# Patient Record
Sex: Male | Born: 1975 | Race: Black or African American | Hispanic: No | Smoking: Former smoker
Health system: Southern US, Community
[De-identification: ages and names within clinical notes are randomized; demographics above are authoritative.]

## PROBLEM LIST (undated history)

## (undated) DIAGNOSIS — S62309A Unspecified fracture of unspecified metacarpal bone, initial encounter for closed fracture: Secondary | ICD-10-CM

## (undated) DIAGNOSIS — J45909 Unspecified asthma, uncomplicated: Secondary | ICD-10-CM

## (undated) DIAGNOSIS — T07XXXA Unspecified multiple injuries, initial encounter: Secondary | ICD-10-CM

## (undated) DIAGNOSIS — S51812A Laceration without foreign body of left forearm, initial encounter: Secondary | ICD-10-CM

## (undated) HISTORY — PX: NO PAST SURGERIES: SHX2092

---

## 1999-05-19 ENCOUNTER — Emergency Department (HOSPITAL_COMMUNITY): Admission: EM | Admit: 1999-05-19 | Discharge: 1999-05-19 | Payer: Self-pay | Admitting: Emergency Medicine

## 2000-05-23 ENCOUNTER — Emergency Department (HOSPITAL_COMMUNITY): Admission: EM | Admit: 2000-05-23 | Discharge: 2000-05-23 | Payer: Self-pay | Admitting: Emergency Medicine

## 2003-07-09 ENCOUNTER — Emergency Department (HOSPITAL_COMMUNITY): Admission: EM | Admit: 2003-07-09 | Discharge: 2003-07-09 | Payer: Self-pay | Admitting: Emergency Medicine

## 2005-09-28 ENCOUNTER — Emergency Department (HOSPITAL_COMMUNITY): Admission: EM | Admit: 2005-09-28 | Discharge: 2005-09-28 | Payer: Self-pay | Admitting: Family Medicine

## 2006-07-26 ENCOUNTER — Emergency Department (HOSPITAL_COMMUNITY): Admission: EM | Admit: 2006-07-26 | Discharge: 2006-07-26 | Payer: Self-pay | Admitting: Family Medicine

## 2006-11-19 ENCOUNTER — Emergency Department (HOSPITAL_COMMUNITY): Admission: EM | Admit: 2006-11-19 | Discharge: 2006-11-19 | Payer: Self-pay | Admitting: Emergency Medicine

## 2013-11-03 ENCOUNTER — Encounter (HOSPITAL_COMMUNITY): Payer: Self-pay | Admitting: Emergency Medicine

## 2013-11-03 ENCOUNTER — Emergency Department (HOSPITAL_COMMUNITY)
Admission: EM | Admit: 2013-11-03 | Discharge: 2013-11-03 | Disposition: A | Discharge status: Home / Self Care | Payer: Self-pay | Attending: Emergency Medicine | Admitting: Emergency Medicine

## 2013-11-03 ENCOUNTER — Emergency Department (HOSPITAL_COMMUNITY): Payer: Self-pay

## 2013-11-03 DIAGNOSIS — T07XXXA Unspecified multiple injuries, initial encounter: Secondary | ICD-10-CM

## 2013-11-03 DIAGNOSIS — F17200 Nicotine dependence, unspecified, uncomplicated: Secondary | ICD-10-CM | POA: Insufficient documentation

## 2013-11-03 DIAGNOSIS — S6990XA Unspecified injury of unspecified wrist, hand and finger(s), initial encounter: Secondary | ICD-10-CM | POA: Insufficient documentation

## 2013-11-03 DIAGNOSIS — Z23 Encounter for immunization: Secondary | ICD-10-CM | POA: Insufficient documentation

## 2013-11-03 DIAGNOSIS — S62309A Unspecified fracture of unspecified metacarpal bone, initial encounter for closed fracture: Secondary | ICD-10-CM

## 2013-11-03 DIAGNOSIS — S51809A Unspecified open wound of unspecified forearm, initial encounter: Secondary | ICD-10-CM | POA: Insufficient documentation

## 2013-11-03 DIAGNOSIS — S62319A Displaced fracture of base of unspecified metacarpal bone, initial encounter for closed fracture: Secondary | ICD-10-CM | POA: Insufficient documentation

## 2013-11-03 DIAGNOSIS — S62307A Unspecified fracture of fifth metacarpal bone, left hand, initial encounter for closed fracture: Secondary | ICD-10-CM

## 2013-11-03 DIAGNOSIS — Z79899 Other long term (current) drug therapy: Secondary | ICD-10-CM | POA: Insufficient documentation

## 2013-11-03 DIAGNOSIS — J45909 Unspecified asthma, uncomplicated: Secondary | ICD-10-CM | POA: Insufficient documentation

## 2013-11-03 DIAGNOSIS — S51812A Laceration without foreign body of left forearm, initial encounter: Secondary | ICD-10-CM

## 2013-11-03 HISTORY — DX: Laceration without foreign body of left forearm, initial encounter: S51.812A

## 2013-11-03 HISTORY — DX: Unspecified fracture of unspecified metacarpal bone, initial encounter for closed fracture: S62.309A

## 2013-11-03 HISTORY — DX: Unspecified asthma, uncomplicated: J45.909

## 2013-11-03 HISTORY — DX: Unspecified multiple injuries, initial encounter: T07.XXXA

## 2013-11-03 MED ORDER — CEPHALEXIN 500 MG PO CAPS: 500.0000 mg | ORAL_CAPSULE | Freq: Four times a day (QID) | ORAL | Status: DC

## 2013-11-03 MED ORDER — OXYCODONE-ACETAMINOPHEN 5-325 MG PO TABS: 1.0000 | ORAL_TABLET | ORAL | Status: DC | PRN

## 2013-11-03 MED ORDER — CEPHALEXIN 250 MG PO CAPS
500.0000 mg | ORAL_CAPSULE | Freq: Once | ORAL | Status: AC
  Administered 2013-11-03: 500 mg via ORAL
  Filled 2013-11-03: qty 2

## 2013-11-03 MED ORDER — OXYCODONE-ACETAMINOPHEN 5-325 MG PO TABS
1.0000 | ORAL_TABLET | Freq: Once | ORAL | Status: AC
  Administered 2013-11-03: 1 via ORAL
  Filled 2013-11-03: qty 1

## 2013-11-03 MED ORDER — TETANUS-DIPHTH-ACELL PERTUSSIS 5-2.5-18.5 LF-MCG/0.5 IM SUSP
0.5000 mL | Freq: Once | INTRAMUSCULAR | Status: AC
  Administered 2013-11-03: 0.5 mL via INTRAMUSCULAR
  Filled 2013-11-03: qty 0.5

## 2013-11-03 NOTE — Progress Notes (Signed)
Orthopedic Tech Progress Note Patient Details:  Elizebeth KollerKelly M Hyslop 03/19/1976 [id number]  Ortho Devices Type of Ortho Device: Ulna gutter splint Ortho Device/Splint Interventions: Application   Cammer, Mickie BailJennifer Carol 11/03/2013, 9:58 AM

## 2013-11-03 NOTE — Discharge Instructions (Signed)
Keep wound dry and do not remove dressing for 24 hours if possible. After that, wash gently morning and night (every 12 hours) with soap and water. Use a topical antibiotic ointment and cover with a bandaid or gauze.    Do NOT use rubbing alcohol or hydrogen peroxide, do not soak the area   Present to your primary care doctor or the urgent care of your choice, or the ED for suture removal in 7-10 days.   Every attempt was made to remove foreign body (contaminants) from the wound.  However, there is always a chance that some may remain in the wound. This can  increase your risk of infection.   If you see signs of infection (warmth, redness, tenderness, pus, sharp increase in pain, fever, red streaking in the skin) immediately return to the emergency department.   After the wound heals fully, apply sunscreen for 6-12 months to minimize scarring.   Sutures need to be removed in 7 days.  Assault, General Assault includes any behavior, whether intentional or reckless, which results in bodily injury to another person and/or damage to property. Included in this would be any behavior, intentional or reckless, that by its nature would be understood (interpreted) by a reasonable person as intent to harm another person or to damage his/her property. Threats may be oral or written. They may be communicated through regular mail, computer, fax, or phone. These threats may be direct or implied. FORMS OF ASSAULT INCLUDE:  Physically assaulting a person. This includes physical threats to inflict physical harm as well as:  Slapping.  Hitting.  Poking.  Kicking.  Punching.  Pushing.  Arson.  Sabotage.  Equipment vandalism.  Damaging or destroying property.  Throwing or hitting objects.  Displaying a weapon or an object that appears to be a weapon in a threatening manner.  Carrying a firearm of any kind.  Using a weapon to harm someone.  Using greater physical size/strength to intimidate  another.  Making intimidating or threatening gestures.  Bullying.  Hazing.  Intimidating, threatening, hostile, or abusive language directed toward another person.  It communicates the intention to engage in violence against that person. And it leads a reasonable person to expect that violent behavior may occur.  Stalking another person. IF IT HAPPENS AGAIN:  Immediately call for emergency help (911 in [place]  If someone poses clear and immediate danger to you, seek legal authorities to have a protective or restraining order put in place.  Less threatening assaults can at least be reported to authorities. STEPS TO TAKE IF A SEXUAL ASSAULT HAS HAPPENED  Go to an area of safety. This may include a shelter or staying with a friend. Stay away from the area where you have been attacked. A large percentage of sexual assaults are caused by a friend, relative or associate.  If medications were given by your caregiver, take them as directed for the full length of time prescribed.  Only take over-the-counter or prescription medicines for pain, discomfort, or fever as directed by your caregiver.  If you have come in contact with a sexual disease, find out if you are to be tested again. If your caregiver is concerned about the HIV/AIDS virus, he/she may require you to have continued testing for several months.  For the protection of your privacy, test results can not be given over the phone. Make sure you receive the results of your test. If your test results are not back during your visit, make an appointment with your  caregiver to find out the results. Do not assume everything is normal if you have not heard from your caregiver or the medical facility. It is important for you to follow up on all of your test results.  File appropriate papers with authorities. This is important in all assaults, even if it has occurred in a family or by a friend. SEEK MEDICAL CARE IF:  You have new problems  because of your injuries.  You have problems that may be because of the medicine you are taking, such as:  Rash.  Itching.  Swelling.  Trouble breathing.  You develop belly (abdominal) pain, feel sick to your stomach (nausea) or are vomiting.  You begin to run a temperature.  You need supportive care or referral to a rape crisis center. These are centers with trained personnel who can help you get through this ordeal. SEEK IMMEDIATE MEDICAL CARE IF:  You are afraid of being threatened, beaten, or abused. In [place] call 911.  You receive new injuries related to abuse.  You develop severe pain in any area injured in the assault or have any change in your condition that concerns you.  You faint or lose consciousness.  You develop chest pain or shortness of breath. Document Released: 03/24/2005 Document Revised: 06/16/2011 Document Reviewed: 11/10/2007 [facility] Patient Information 2015 [place], [place]. This information is not intended to replace advice given to you by your health care provider. Make sure you discuss any questions you have with your health care provider.  Laceration Care, Adult A laceration is a cut or lesion that goes through all layers of the skin and into the tissue just beneath the skin. TREATMENT  Some lacerations may not require closure. Some lacerations may not be able to be closed due to an increased risk of infection. It is important to see your caregiver as soon as possible after an injury to minimize the risk of infection and maximize the opportunity for successful closure. If closure is appropriate, pain medicines may be given, if needed. The wound will be cleaned to help prevent infection. Your caregiver will use stitches (sutures), staples, wound glue (adhesive), or skin adhesive strips to repair the laceration. These tools bring the skin edges together to allow for faster healing and a better cosmetic outcome. However, all wounds will heal with a scar.  Once the wound has healed, scarring can be minimized by covering the wound with sunscreen during the day for 1 full year. HOME CARE INSTRUCTIONS  For sutures or staples:  Keep the wound clean and dry.  If you were given a bandage (dressing), you should change it at least once a day. Also, change the dressing if it becomes wet or dirty, or as directed by your caregiver.  Wash the wound with soap and water 2 times a day. Rinse the wound off with water to remove all soap. Pat the wound dry with a clean towel.  After cleaning, apply a thin layer of the antibiotic ointment as recommended by your caregiver. This will help prevent infection and keep the dressing from sticking.  You may shower as usual after the first 24 hours. Do not soak the wound in water until the sutures are removed.  Only take over-the-counter or prescription medicines for pain, discomfort, or fever as directed by your caregiver.  Get your sutures or staples removed as directed by your caregiver. For skin adhesive strips:  Keep the wound clean and dry.  Do not get the skin adhesive strips wet. You may  bathe carefully, using caution to keep the wound dry.  If the wound gets wet, pat it dry with a clean towel.  Skin adhesive strips will fall off on their own. You may trim the strips as the wound heals. Do not remove skin adhesive strips that are still stuck to the wound. They will fall off in time. For wound adhesive:  You may briefly wet your wound in the shower or bath. Do not soak or scrub the wound. Do not swim. Avoid periods of heavy perspiration until the skin adhesive has fallen off on its own. After showering or bathing, gently pat the wound dry with a clean towel.  Do not apply liquid medicine, cream medicine, or ointment medicine to your wound while the skin adhesive is in place. This may loosen the film before your wound is healed.  If a dressing is placed over the wound, be careful not to apply tape directly  over the skin adhesive. This may cause the adhesive to be pulled off before the wound is healed.  Avoid prolonged exposure to sunlight or tanning lamps while the skin adhesive is in place. Exposure to ultraviolet light in the first year will darken the scar.  The skin adhesive will usually remain in place for 5 to 10 days, then naturally fall off the skin. Do not pick at the adhesive film. You may need a tetanus shot if:  You cannot remember when you had your last tetanus shot.  You have never had a tetanus shot. If you get a tetanus shot, your arm may swell, get red, and feel warm to the touch. This is common and not a problem. If you need a tetanus shot and you choose not to have one, there is a rare chance of getting tetanus. Sickness from tetanus can be serious. SEEK MEDICAL CARE IF:   You have redness, swelling, or increasing pain in the wound.  You see a red line that goes away from the wound.  You have yellowish-white fluid (pus) coming from the wound.  You have a fever.  You notice a bad smell coming from the wound or dressing.  Your wound breaks open before or after sutures have been removed.  You notice something coming out of the wound such as wood or glass.  Your wound is on your hand or foot and you cannot move a finger or toe. SEEK IMMEDIATE MEDICAL CARE IF:   Your pain is not controlled with prescribed medicine.  You have severe swelling around the wound causing pain and numbness or a change in color in your arm, hand, leg, or foot.  Your wound splits open and starts bleeding.  You have worsening numbness, weakness, or loss of function of any joint around or beyond the wound.  You develop painful lumps near the wound or on the skin anywhere on your body. MAKE SURE YOU:   Understand these instructions.  Will watch your condition.  Will get help right away if you are not doing well or get worse. Document Released: 03/24/2005 Document Revised: 06/16/2011  Document Reviewed: 09/17/2010 [facility] Patient Information 2015 [place], [place]. This information is not intended to replace advice given to you by your health care provider. Make sure you discuss any questions you have with your health care provider.  Boxer's Fracture You have a break (fracture) of the fifth metacarpal bone. This is commonly called a boxer's fracture. This is the bone in the hand where the little finger attaches. The fracture is in  the end of that bone, closest to the little finger. It is usually caused when you hit an object with a clenched fist. Often, the knuckle is pushed down by the impact. Sometimes, the fracture rotates out of position. A boxer's fracture will usually heal within 6 weeks, if it is treated properly and protected from re-injury. Surgery is sometimes needed. A cast, splint, or bulky hand dressing may be used to protect and immobilize a boxer's fracture. Do not remove this device or dressing until your caregiver approves. Keep your hand elevated, and apply ice packs for 15-20 minutes every 2 hours, for the first 2 days. Elevation and ice help reduce swelling and relieve pain. See your caregiver, or an orthopedic specialist, for follow-up care within the next 10 days. This is to make sure your fracture is healing properly. Document Released: 03/24/2005 Document Revised: 06/16/2011 Document Reviewed: 09/11/2006 [facility] Patient Information 2015 [place], [place]. This information is not intended to replace advice given to you by your health care provider. Make sure you discuss any questions you have with your health care provider.  Cast or Splint Care Casts and splints support injured limbs and keep bones from moving while they heal. It is important to care for your cast or splint at home.  HOME CARE INSTRUCTIONS  Keep the cast or splint uncovered during the drying period. It can take 24 to 48 hours to dry if it is made of plaster. A fiberglass cast will dry in less  than 1 hour.  Do not rest the cast on anything harder than a pillow for the first 24 hours.  Do not put weight on your injured limb or apply pressure to the cast until your health care provider gives you permission.  Keep the cast or splint dry. Wet casts or splints can lose their shape and may not support the limb as well. A wet cast that has lost its shape can also create harmful pressure on your skin when it dries. Also, wet skin can become infected.  Cover the cast or splint with a plastic bag when bathing or when out in the rain or snow. If the cast is on the trunk of the body, take sponge baths until the cast is removed.  If your cast does become wet, dry it with a towel or a blow dryer on the cool setting only.  Keep your cast or splint clean. Soiled casts may be wiped with a moistened cloth.  Do not place any hard or soft foreign objects under your cast or splint, such as cotton, toilet paper, lotion, or powder.  Do not try to scratch the skin under the cast with any object. The object could get stuck inside the cast. Also, scratching could lead to an infection. If itching is a problem, use a blow dryer on a cool setting to relieve discomfort.  Do not trim or cut your cast or remove padding from inside of it.  Exercise all joints next to the injury that are not immobilized by the cast or splint. For example, if you have a long leg cast, exercise the hip joint and toes. If you have an arm cast or splint, exercise the shoulder, elbow, thumb, and fingers.  Elevate your injured arm or leg on 1 or 2 pillows for the first 1 to 3 days to decrease swelling and pain.It is best if you can comfortably elevate your cast so it is higher than your heart. SEEK MEDICAL CARE IF:   Your cast or  splint cracks.  Your cast or splint is too tight or too loose.  You have unbearable itching inside the cast.  Your cast becomes wet or develops a soft spot or area.  You have a bad smell coming from  inside your cast.  You get an object stuck under your cast.  Your skin around the cast becomes red or raw.  You have new pain or worsening pain after the cast has been applied. SEEK IMMEDIATE MEDICAL CARE IF:   You have fluid leaking through the cast.  You are unable to move your fingers or toes.  You have discolored (blue or white), cool, painful, or very swollen fingers or toes beyond the cast.  You have tingling or numbness around the injured area.  You have severe pain or pressure under the cast.  You have any difficulty with your breathing or have shortness of breath.  You have chest pain. Document Released: 03/21/2000 Document Revised: 01/12/2013 Document Reviewed: 09/30/2012 [facility] Patient Information 2015 [place], [place]. This information is not intended to replace advice given to you by your health care provider. Make sure you discuss any questions you have with your health care provider.  Acetaminophen; Oxycodone tablets What is this medicine? ACETAMINOPHEN; OXYCODONE (a set a MEE noe fen; ox i KOE done) is a pain reliever. It is used to treat mild to moderate pain. This medicine may be used for other purposes; ask your health care provider or pharmacist if you have questions. COMMON BRAND NAME(S): Endocet, Magnacet, Narvox, Percocet, Perloxx, Primalev, Primlev, Roxicet, Xolox What should I tell my health care provider before I take this medicine? They need to know if you have any of these conditions: -brain tumor -Crohn's disease, inflammatory bowel disease, or ulcerative colitis -drug abuse or addiction -head injury -heart or circulation problems -if you often drink alcohol -kidney disease or problems going to the bathroom -liver disease -lung disease, asthma, or breathing problems -an unusual or allergic reaction to acetaminophen, oxycodone, other opioid analgesics, other medicines, foods, dyes, or preservatives -pregnant or trying to get  pregnant -breast-feeding How should I use this medicine? Take this medicine by mouth with a full glass of water. Follow the directions on the prescription label. Take your medicine at regular intervals. Do not take your medicine more often than directed. Talk to your pediatrician regarding the use of this medicine in children. Special care may be needed. Patients over 70 years old may have a stronger reaction and need a smaller dose. Overdosage: If you think you have taken too much of this medicine contact a poison control center or emergency room at once. NOTE: This medicine is only for you. Do not share this medicine with others. What if I miss a dose? If you miss a dose, take it as soon as you can. If it is almost time for your next dose, take only that dose. Do not take double or extra doses. What may interact with this medicine? -alcohol -antihistamines -barbiturates like amobarbital, butalbital, butabarbital, methohexital, pentobarbital, phenobarbital, thiopental, and secobarbital -benztropine -drugs for bladder problems like solifenacin, trospium, oxybutynin, tolterodine, hyoscyamine, and methscopolamine -drugs for breathing problems like ipratropium and tiotropium -drugs for certain stomach or intestine problems like propantheline, homatropine methylbromide, glycopyrrolate, atropine, belladonna, and dicyclomine -general anesthetics like etomidate, ketamine, nitrous oxide, propofol, desflurane, enflurane, halothane, isoflurane, and sevoflurane -medicines for depression, anxiety, or psychotic disturbances -medicines for sleep -muscle relaxants -naltrexone -narcotic medicines (opiates) for pain -phenothiazines like perphenazine, thioridazine, chlorpromazine, mesoridazine, fluphenazine, prochlorperazine, promazine, and trifluoperazine -  scopolamine -tramadol -trihexyphenidyl This list may not describe all possible interactions. Give your health care provider a list of all the  medicines, herbs, non-prescription drugs, or dietary supplements you use. Also tell them if you smoke, drink alcohol, or use illegal drugs. Some items may interact with your medicine. What should I watch for while using this medicine? Tell your doctor or health care professional if your pain does not go away, if it gets worse, or if you have new or a different type of pain. You may develop tolerance to the medicine. Tolerance means that you will need a higher dose of the medication for pain relief. Tolerance is normal and is expected if you take this medicine for a long time. Do not suddenly stop taking your medicine because you may develop a severe reaction. Your body becomes used to the medicine. This does NOT mean you are addicted. Addiction is a behavior related to getting and using a drug for a non-medical reason. If you have pain, you have a medical reason to take pain medicine. Your doctor will tell you how much medicine to take. If your doctor wants you to stop the medicine, the dose will be slowly lowered over time to avoid any side effects. You may get drowsy or dizzy. Do not drive, use machinery, or do anything that needs mental alertness until you know how this medicine affects you. Do not stand or sit up quickly, especially if you are an older patient. This reduces the risk of dizzy or fainting spells. Alcohol may interfere with the effect of this medicine. Avoid alcoholic drinks. There are different types of narcotic medicines (opiates) for pain. If you take more than one type at the same time, you may have more side effects. Give your health care provider a list of all medicines you use. Your doctor will tell you how much medicine to take. Do not take more medicine than directed. Call emergency for help if you have problems breathing. The medicine will cause constipation. Try to have a bowel movement at least every 2 to 3 days. If you do not have a bowel movement for 3 days, call your doctor or  health care professional. Do not take Tylenol (acetaminophen) or medicines that have acetaminophen with this medicine. Too much acetaminophen can be very dangerous. Many nonprescription medicines contain acetaminophen. Always read the labels carefully to avoid taking more acetaminophen. What side effects may I notice from receiving this medicine? Side effects that you should report to your doctor or health care professional as soon as possible: -allergic reactions like skin rash, itching or hives, swelling of the face, lips, or tongue -breathing difficulties, wheezing -confusion -light headedness or fainting spells -severe stomach pain -unusually weak or tired -yellowing of the skin or the whites of the eyes Side effects that usually do not require medical attention (report to your doctor or health care professional if they continue or are bothersome): -dizziness -drowsiness -nausea -vomiting This list may not describe all possible side effects. Call your doctor for medical advice about side effects. You may report side effects to FDA at 1-800-FDA-1088. Where should I keep my medicine? Keep out of the reach of children. This medicine can be abused. Keep your medicine in a safe place to protect it from theft. Do not share this medicine with anyone. Selling or giving away this medicine is dangerous and against the law. Store at room temperature between 20 and 25 degrees C (68 and 77 degrees F). Keep container tightly  closed. Protect from light. This medicine may cause accidental overdose and death if it is taken by other adults, children, or pets. Flush any unused medicine down the toilet to reduce the chance of harm. Do not use the medicine after the expiration date. NOTE: This sheet is a summary. It may not cover all possible information. If you have questions about this medicine, talk to your doctor, pharmacist, or health care provider.  2015, [employer]. (2012-11-15 13:17:35)

## 2013-11-03 NOTE — ED Notes (Signed)
Called ortho to apply ulnar gutter splint

## 2013-11-03 NOTE — ED Notes (Addendum)
Pt refused arm sling after discussing necessity of wearing one.
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
## 2013-11-03 NOTE — ED Notes (Signed)
MD at bedside. 

## 2013-11-03 NOTE — ED Notes (Signed)
Ortho at bedside.
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
## 2013-11-03 NOTE — Progress Notes (Signed)
[facility] Community Liaison at [employer] [employer] :  Will be [facility] and other resources, to help patient establish primary care.
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
## 2013-11-03 NOTE — ED Notes (Signed)
MD at bedside with suture cart. 

## 2013-11-08 ENCOUNTER — Encounter (HOSPITAL_BASED_OUTPATIENT_CLINIC_OR_DEPARTMENT_OTHER): Payer: Self-pay | Admitting: *Deleted

## 2013-11-08 ENCOUNTER — Encounter (HOSPITAL_COMMUNITY): Payer: Self-pay | Admitting: Emergency Medicine

## 2013-11-08 ENCOUNTER — Emergency Department (INDEPENDENT_AMBULATORY_CARE_PROVIDER_SITE_OTHER)
Admission: EM | Admit: 2013-11-08 | Discharge: 2013-11-08 | Disposition: A | Discharge status: Home / Self Care | Payer: Self-pay | Source: Home / Self Care | Attending: Emergency Medicine | Admitting: Emergency Medicine

## 2013-11-08 ENCOUNTER — Other Ambulatory Visit: Payer: Self-pay | Admitting: Orthopedic Surgery

## 2013-11-08 DIAGNOSIS — T798XXA Other early complications of trauma, initial encounter: Secondary | ICD-10-CM

## 2013-11-08 DIAGNOSIS — S62308S Unspecified fracture of other metacarpal bone, sequela: Secondary | ICD-10-CM

## 2013-11-08 DIAGNOSIS — S51812D Laceration without foreign body of left forearm, subsequent encounter: Secondary | ICD-10-CM

## 2013-11-08 DIAGNOSIS — S51809A Unspecified open wound of unspecified forearm, initial encounter: Secondary | ICD-10-CM

## 2013-11-08 MED ORDER — CEPHALEXIN 500 MG PO CAPS: 1000.0000 mg | ORAL_CAPSULE | Freq: Three times a day (TID) | ORAL | Status: DC

## 2013-11-08 MED ORDER — SULFAMETHOXAZOLE-TMP DS 800-160 MG PO TABS: 1.0000 | ORAL_TABLET | Freq: Three times a day (TID) | ORAL | Status: DC

## 2013-11-08 NOTE — Progress Notes (Signed)
Orthopedic Tech Progress Note Patient Details:  Darrell Wright 05/02/1975 [id number]  Ortho Devices Type of Ortho Device: Ulna gutter splint;Ace wrap Ortho Device/Splint Interventions: Application   Cammer, Mickie BailJennifer Carol 11/08/2013, 12:48 PM

## 2013-11-08 NOTE — ED Provider Notes (Signed)
Medical screening examination/treatment/procedure(s) were performed by resident physician or non-physician practitioner and as supervising physician I was immediately available for consultation/collaboration.  Randal BubaErin Honig, MD     Charm RingsErin J Honig, MD 11/08/13 [phone]

## 2013-11-08 NOTE — ED Provider Notes (Signed)
CSN: [id number]     Arrival date & time 11/08/13  4098 History   First MD Initiated Contact with Patient 11/08/13 1025     Chief Complaint  Patient presents with  . Suture / Staple Removal   (Consider location/radiation/quality/duration/timing/severity/associated sxs/prior Treatment) HPI Comments: 38 year old male presents for followup and to be cleared to return to work. He is also concerned that his wound on his arm may be infected. He was seen in the emergency department 5 days ago and was diagnosed with an angulated left fifth metacarpal fracture and a laceration on the left forearm that required a 2 layer closure. He was put in an ulnar gutter splint after the laceration was repaired. He took the splint off yesterday because he thought he had to take it off to return to work. He is also unaware that he has a fracture in his hand. He has been cleaning the wound with peroxide and cover it with Neosporin. He says he called the hand doctor but they couldn't see him on that day so he thought he might not really need to see them. He complains of increasing pain around the laceration and in his hands since removing the splint. S. he also has some drainage from the wound. No systemic symptoms. He is taking Keflex as prescribed.  Patient is a 38 y.o. male presenting with suture removal.  Suture / Staple Removal    Past Medical History  Diagnosis Date  . Asthma    Past Surgical History  Procedure Laterality Date  . Fracture surgery     History reviewed. No pertinent family history. History  Substance Use Topics  . Smoking status: Current Every Day Smoker  . Smokeless tobacco: Never Used  . Alcohol Use: Yes    Review of Systems  Musculoskeletal:       Hand pain and swelling  Skin: Positive for wound.  All other systems reviewed and are negative.   Allergies  Review of patient's allergies indicates no known allergies.  Home Medications   Prior to Admission medications   Medication  Sig Start Date End Date Taking? Authorizing Provider  cephALEXin (KEFLEX) 500 MG capsule Take 1 capsule (500 mg total) by mouth 4 (four) times daily. 11/03/13  Yes Nicole Pisciotta, PA-C  albuterol (PROVENTIL HFA;VENTOLIN HFA) 108 (90 BASE) MCG/ACT inhaler Inhale 2 puffs into the lungs every 6 (six) hours as needed for wheezing or shortness of breath.    Historical Provider, MD  cephALEXin (KEFLEX) 500 MG capsule Take 2 capsules (1,000 mg total) by mouth 3 (three) times daily. 11/08/13   Graylon Good, PA-C  oxyCODONE-acetaminophen (ROXICET) 5-325 MG per tablet Take 1-2 tablets by mouth every 4 (four) hours as needed for severe pain. 11/03/13   Nicole Pisciotta, PA-C  sulfamethoxazole-trimethoprim (BACTRIM DS) 800-160 MG per tablet Take 1 tablet by mouth 3 (three) times daily. 11/08/13   Adrian Blackwater Javarious Elsayed, PA-C   BP 152/104  Pulse 64  Temp(Src) 98.8 F (37.1 C)  Resp 16  SpO2 100% Physical Exam  Nursing note and vitals reviewed. Constitutional: He is oriented to person, place, and time. He appears well-developed and well-nourished. No distress.  HENT:  Head: Normocephalic.  Pulmonary/Chest: Effort normal. No respiratory distress.  Musculoskeletal:       Left forearm: He exhibits laceration.       Arms:      Left hand: He exhibits tenderness and swelling (Significant swelling and tenderness over the fifth metacarpal). He exhibits normal capillary refill. Normal sensation  noted.  Neurological: He is alert and oriented to person, place, and time. Coordination normal.  Skin: Skin is warm and dry. No rash noted. He is not diaphoretic.  Psychiatric: He has a normal mood and affect. Judgment normal.    ED Course  Procedures (including critical care time) Labs Review Labs Reviewed  WOUND CULTURE    Imaging Review No results found.   MDM   1. Closed fracture of 5th metacarpal, sequela   2. Laceration of forearm, left, subsequent encounter   3. Wound infection, initial encounter    This  patient has an angulated fracture, it would likely require surgical stabilization discuss this with him and he will followup with hand surgery. Also he has a wound infection despite taking Keflex, will increase the dose of Keflex and add Bactrim. He has followup with hand surgery in 2 days.   Meds ordered this encounter  Medications  . cephALEXin (KEFLEX) 500 MG capsule    Sig: Take 2 capsules (1,000 mg total) by mouth 3 (three) times daily.    Dispense:  60 capsule    Refill:  0    Order Specific Question:  Supervising Provider    Answer:  Clementeen GrahamOREY, EVAN, S [id number]]  . sulfamethoxazole-trimethoprim (BACTRIM DS) 800-160 MG per tablet    Sig: Take 1 tablet by mouth 3 (three) times daily.    Dispense:  30 tablet    Refill:  0    Order Specific Question:  Supervising Provider    Answer:  Clementeen GrahamOREY, EVAN, Kathie RhodesS [[id number]]       Graylon GoodZachary H Lynnda Wiersma, PA-C 11/08/13 [phone]

## 2013-11-08 NOTE — ED Notes (Addendum)
Here for suture removal left forearm.   Area is red, mild swelling, and having some drainage.  States noticed today.  Pt is currently taking Keflex as prescribed.

## 2013-11-08 NOTE — ED Notes (Signed)
Pt waiting orth tech, then will discharge.  Mw,cma

## 2013-11-08 NOTE — Discharge Instructions (Signed)
You need to call Dr. Ronie SpiesWeingold's office today to schedule a followup appointment as soon as possible.  Sutured Wound Care Sutures are stitches that can be used to close wounds. Caring for your wound can help stop infection and lessen pain. HOME CARE   Rest and raise (elevate) the injured area until the pain and puffiness (swelling) go away.  Only take medicines as told by your doctor.  Clean the wound gently with mild soap and water once a day after the first 2 days. Rinse off the soap. Pat the area dry with a clean towel. Do not rub the wound.  Change the bandage (dressing) as told by your doctor. If the bandage sticks, soak it off with soapy water. Stop using a bandage after 2 days or after the wound stops leaking fluid.  Put cream on the wound as told by your doctor.  Do not stretch the wound.  Drink enough fluids to keep your pee (urine) clear or pale yellow.  See your doctor to have the sutures removed.  Use sunscreen or sunblock on the wound after it heals. GET HELP RIGHT AWAY IF:   Your wound gets red, puffy, hot, or tender.  You have more pain in the wound.  You have a red streak that goes away from the wound.  You see yellowish-white fluid (pus) coming out of the wound.  You have a fever.  You have chills and start to shake.  You notice a bad smell coming from the wound.  Your wound will not stop bleeding. MAKE SURE YOU:   Understand these instructions.  Will watch your condition.  Will get help right away if you are not doing well or get worse. Document Released: 09/10/2007 Document Revised: 06/16/2011 Document Reviewed: 07/28/2010 [facility] Patient Information 2015 [place]. This information is not intended to replace advice given to you by your health care provider. Make sure you discuss any questions you have with your health care provider.  Wound Infection A wound infection happens when a type of germ (bacteria) starts growing in the wound. In  some cases, this can cause the wound to break open. If cared for properly, the infected wound will heal from the inside to the outside. Wound infections need treatment. CAUSES An infection is caused by bacteria growing in the wound.  SYMPTOMS   Increase in redness, swelling, or pain at the wound site.  Increase in drainage at the wound site.  Wound or bandage (dressing) starts to smell bad.  Fever.  Feeling tired or fatigued.  Pus draining from the wound. TREATMENT  Your health care provider will prescribe antibiotic medicine. The wound infection should improve within 24 to 48 hours. Any redness around the wound should stop spreading and the wound should be less painful.  HOME CARE INSTRUCTIONS   Only take over-the-counter or prescription medicines for pain, discomfort, or fever as directed by your health care provider.  Take your antibiotics as directed. Finish them even if you start to feel better.  Gently wash the area with mild soap and water 2 times a day, or as directed. Rinse off the soap. Pat the area dry with a clean towel. Do not rub the wound. This may cause bleeding.  Follow your health care provider's instructions for how often you need to change the dressing.  Apply ointment and a dressing to the wound as directed.  If the dressing sticks, moisten it with soapy water and gently remove it.  Change the bandage right away  if it becomes wet, dirty, or develops a bad smell.  Take showers. Do not take tub baths, swim, or do anything that may soak the wound until it is healed.  Avoid exercises that make you sweat heavily.  Use anti-itch medicine as directed by your health care provider. The wound may itch when it is healing. Do not pick or scratch at the wound.  Follow up with your health care provider to get your wound rechecked as directed. SEEK MEDICAL CARE IF:  You have an increase in swelling, pain, or redness around the wound.  You have an increase in the  amount of pus coming from the wound.  There is a bad smell coming from the wound.  More of the wound breaks open.  You have a fever. MAKE SURE YOU:   Understand these instructions.  Will watch your condition.  Will get help right away if you are not doing well or get worse. Document Released: 12/21/2002 Document Revised: 03/29/2013 Document Reviewed: 07/28/2010 [facility] Patient Information 2015 [place], [place]. This information is not intended to replace advice given to you by your health care provider. Make sure you discuss any questions you have with your health care provider.

## 2013-11-09 ENCOUNTER — Ambulatory Visit (HOSPITAL_BASED_OUTPATIENT_CLINIC_OR_DEPARTMENT_OTHER)
Admission: RE | Admit: 2013-11-09 | Discharge: 2013-11-09 | Disposition: A | Discharge status: Home / Self Care | Payer: Self-pay | Source: Ambulatory Visit | Attending: Orthopedic Surgery | Admitting: Orthopedic Surgery

## 2013-11-09 ENCOUNTER — Encounter (HOSPITAL_BASED_OUTPATIENT_CLINIC_OR_DEPARTMENT_OTHER): Payer: Self-pay | Admitting: Anesthesiology

## 2013-11-09 ENCOUNTER — Ambulatory Visit (HOSPITAL_BASED_OUTPATIENT_CLINIC_OR_DEPARTMENT_OTHER): Payer: Self-pay | Admitting: Anesthesiology

## 2013-11-09 ENCOUNTER — Encounter (HOSPITAL_BASED_OUTPATIENT_CLINIC_OR_DEPARTMENT_OTHER)
Admission: RE | Disposition: A | Discharge status: Home / Self Care | Payer: Self-pay | Source: Ambulatory Visit | Attending: Orthopedic Surgery

## 2013-11-09 ENCOUNTER — Encounter (HOSPITAL_BASED_OUTPATIENT_CLINIC_OR_DEPARTMENT_OTHER): Payer: Self-pay | Admitting: *Deleted

## 2013-11-09 DIAGNOSIS — S62309A Unspecified fracture of unspecified metacarpal bone, initial encounter for closed fracture: Secondary | ICD-10-CM | POA: Insufficient documentation

## 2013-11-09 DIAGNOSIS — F17200 Nicotine dependence, unspecified, uncomplicated: Secondary | ICD-10-CM | POA: Insufficient documentation

## 2013-11-09 HISTORY — DX: Laceration without foreign body of left forearm, initial encounter: S51.812A

## 2013-11-09 HISTORY — DX: Unspecified fracture of unspecified metacarpal bone, initial encounter for closed fracture: S62.309A

## 2013-11-09 HISTORY — PX: OPEN REDUCTION INTERNAL FIXATION (ORIF) METACARPAL: SHX6234

## 2013-11-09 HISTORY — DX: Unspecified multiple injuries, initial encounter: T07.XXXA

## 2013-11-09 SURGERY — OPEN REDUCTION INTERNAL FIXATION (ORIF) METACARPAL
Anesthesia: Regional | Site: Finger | Laterality: Left

## 2013-11-09 MED ORDER — FENTANYL CITRATE 0.05 MG/ML IJ SOLN
INTRAMUSCULAR | Status: AC
  Filled 2013-11-09: qty 2

## 2013-11-09 MED ORDER — CEFAZOLIN SODIUM-DEXTROSE 2-3 GM-% IV SOLR
INTRAVENOUS | Status: AC
  Filled 2013-11-09: qty 50

## 2013-11-09 MED ORDER — CEFAZOLIN SODIUM-DEXTROSE 2-3 GM-% IV SOLR
2.0000 g | INTRAVENOUS | Status: AC
  Administered 2013-11-09: 2 g via INTRAVENOUS

## 2013-11-09 MED ORDER — LIDOCAINE HCL (CARDIAC) 20 MG/ML IV SOLN
INTRAVENOUS | Status: DC | PRN
Start: 2013-11-09 — End: 2013-11-09
  Administered 2013-11-09: 50 mg via INTRAVENOUS

## 2013-11-09 MED ORDER — FENTANYL CITRATE 0.05 MG/ML IJ SOLN
50.0000 ug | INTRAMUSCULAR | Status: DC | PRN
  Administered 2013-11-09: 100 ug via INTRAVENOUS

## 2013-11-09 MED ORDER — MIDAZOLAM HCL 2 MG/2ML IJ SOLN
1.0000 mg | INTRAMUSCULAR | Status: DC | PRN
  Administered 2013-11-09: 2 mg via INTRAVENOUS

## 2013-11-09 MED ORDER — MIDAZOLAM HCL 5 MG/5ML IJ SOLN
INTRAMUSCULAR | Status: DC | PRN
  Administered 2013-11-09: 2 mg via INTRAVENOUS

## 2013-11-09 MED ORDER — OXYCODONE-ACETAMINOPHEN 5-325 MG PO TABS: 1.0000 | ORAL_TABLET | ORAL | Status: DC | PRN

## 2013-11-09 MED ORDER — HYDROMORPHONE HCL PF 1 MG/ML IJ SOLN: 0.2500 mg | INTRAMUSCULAR | Status: DC | PRN

## 2013-11-09 MED ORDER — MIDAZOLAM HCL 2 MG/ML PO SYRP: 12.0000 mg | ORAL_SOLUTION | Freq: Once | ORAL | Status: DC | PRN

## 2013-11-09 MED ORDER — PROPOFOL 10 MG/ML IV BOLUS
INTRAVENOUS | Status: DC | PRN
  Administered 2013-11-09: 300 mg via INTRAVENOUS

## 2013-11-09 MED ORDER — MIDAZOLAM HCL 2 MG/2ML IJ SOLN
INTRAMUSCULAR | Status: AC
  Filled 2013-11-09: qty 2

## 2013-11-09 MED ORDER — BUPIVACAINE-EPINEPHRINE (PF) 0.5% -1:200000 IJ SOLN
INTRAMUSCULAR | Status: DC | PRN
  Administered 2013-11-09: 30 mL via PERINEURAL

## 2013-11-09 MED ORDER — DEXAMETHASONE SODIUM PHOSPHATE 10 MG/ML IJ SOLN
INTRAMUSCULAR | Status: DC | PRN
  Administered 2013-11-09: 10 mg via INTRAVENOUS

## 2013-11-09 MED ORDER — OXYCODONE HCL 5 MG/5ML PO SOLN: 5.0000 mg | Freq: Once | ORAL | Status: DC | PRN

## 2013-11-09 MED ORDER — OXYCODONE HCL 5 MG PO TABS: 5.0000 mg | ORAL_TABLET | Freq: Once | ORAL | Status: DC | PRN

## 2013-11-09 MED ORDER — CHLORHEXIDINE GLUCONATE 4 % EX LIQD
60.0000 mL | Freq: Once | CUTANEOUS | Status: DC
Start: 2013-11-09 — End: 2013-11-09

## 2013-11-09 MED ORDER — LACTATED RINGERS IV SOLN
INTRAVENOUS | Status: DC
Start: 2013-11-09 — End: 2013-11-09
  Administered 2013-11-09 (×2): via INTRAVENOUS

## 2013-11-09 SURGICAL SUPPLY — 75 items
APL SKNCLS STERI-STRIP NONHPOA (GAUZE/BANDAGES/DRESSINGS) ×1
BANDAGE ELASTIC 3 VELCRO ST LF (GAUZE/BANDAGES/DRESSINGS) ×2 IMPLANT
BANDAGE ELASTIC 4 VELCRO ST LF (GAUZE/BANDAGES/DRESSINGS) IMPLANT
BENZOIN TINCTURE PRP APPL 2/3 (GAUZE/BANDAGES/DRESSINGS) ×1 IMPLANT
BIT DRILL 1.1 MINI (BIT) IMPLANT
BLADE SURG 15 STRL LF DISP TIS (BLADE) ×1 IMPLANT
BLADE SURG 15 STRL SS (BLADE) ×2
BNDG CMPR 9X4 STRL LF SNTH (GAUZE/BANDAGES/DRESSINGS) ×1
BNDG CMPR MD 5X2 ELC HKLP STRL (GAUZE/BANDAGES/DRESSINGS)
BNDG ELASTIC 2 VLCR STRL LF (GAUZE/BANDAGES/DRESSINGS) IMPLANT
BNDG ESMARK 4X9 LF (GAUZE/BANDAGES/DRESSINGS) ×1 IMPLANT
BNDG GAUZE ELAST 4 BULKY (GAUZE/BANDAGES/DRESSINGS) ×1 IMPLANT
CANISTER SUCT 1200ML W/VALVE (MISCELLANEOUS) IMPLANT
CORDS BIPOLAR (ELECTRODE) ×1 IMPLANT
COVER TABLE BACK 60X90 (DRAPES) ×2 IMPLANT
CUFF TOURNIQUET SINGLE 18IN (TOURNIQUET CUFF) ×1 IMPLANT
DECANTER SPIKE VIAL GLASS SM (MISCELLANEOUS) IMPLANT
DRAPE EXTREMITY T 121X128X90 (DRAPE) ×2 IMPLANT
DRAPE OEC MINIVIEW 54X84 (DRAPES) ×2 IMPLANT
DRAPE SURG 17X23 STRL (DRAPES) ×2 IMPLANT
DRILL BIT 1.1 MINI (BIT) ×2
DURAPREP 26ML APPLICATOR (WOUND CARE) ×2 IMPLANT
GAUZE SPONGE 4X4 12PLY STRL (GAUZE/BANDAGES/DRESSINGS) ×2 IMPLANT
GAUZE SPONGE 4X4 16PLY XRAY LF (GAUZE/BANDAGES/DRESSINGS) IMPLANT
GAUZE XEROFORM 1X8 LF (GAUZE/BANDAGES/DRESSINGS) IMPLANT
GLOVE BIO SURGEON STRL SZ7 (GLOVE) ×1 IMPLANT
GLOVE BIOGEL PI IND STRL 7.0 (GLOVE) IMPLANT
GLOVE BIOGEL PI IND STRL 7.5 (GLOVE) IMPLANT
GLOVE BIOGEL PI INDICATOR 7.0 (GLOVE) ×1
GLOVE BIOGEL PI INDICATOR 7.5 (GLOVE) ×1
GLOVE ECLIPSE 6.5 STRL STRAW (GLOVE) ×1 IMPLANT
GLOVE SURG SYN 8.0 (GLOVE) ×2 IMPLANT
GLOVE SURG SYN 8.0 PF PI (GLOVE) ×2 IMPLANT
GOWN STRL REUS W/ TWL LRG LVL3 (GOWN DISPOSABLE) ×1 IMPLANT
GOWN STRL REUS W/TWL LRG LVL3 (GOWN DISPOSABLE) ×4
GOWN STRL REUS W/TWL XL LVL3 (GOWN DISPOSABLE) ×2 IMPLANT
NDL HYPO 25X1 1.5 SAFETY (NEEDLE) IMPLANT
NEEDLE HYPO 25X1 1.5 SAFETY (NEEDLE) IMPLANT
NS IRRIG 1000ML POUR BTL (IV SOLUTION) ×1 IMPLANT
PACK BASIN DAY SURGERY FS (CUSTOM PROCEDURE TRAY) ×2 IMPLANT
PAD CAST 3X4 CTTN HI CHSV (CAST SUPPLIES) ×1 IMPLANT
PAD CAST 4YDX4 CTTN HI CHSV (CAST SUPPLIES) IMPLANT
PADDING CAST ABS 4INX4YD NS (CAST SUPPLIES)
PADDING CAST ABS COTTON 4X4 ST (CAST SUPPLIES) ×1 IMPLANT
PADDING CAST COTTON 3X4 STRL (CAST SUPPLIES) ×2
PADDING CAST COTTON 4X4 STRL (CAST SUPPLIES)
PADDING UNDERCAST 2 STRL (CAST SUPPLIES) ×1
PADDING UNDERCAST 2X4 STRL (CAST SUPPLIES) ×1 IMPLANT
PLATE STRAIGHT 1.5 6 HOLE (Plate) ×1 IMPLANT
SCREW CORTEX 1.5X10 (Screw) ×2 IMPLANT
SCREW CORTEX 1.5X11 (Screw) ×1 IMPLANT
SCREW CORTEX 1.5X12 (Screw) ×1 IMPLANT
SCREW CORTEX 1.5X9 (Screw) ×2 IMPLANT
SHEET MEDIUM DRAPE 40X70 STRL (DRAPES) ×2 IMPLANT
SPLINT PLASTER CAST XFAST 4X15 (CAST SUPPLIES) IMPLANT
SPLINT PLASTER XTRA FAST SET 4 (CAST SUPPLIES) ×15
STOCKINETTE 4X48 STRL (DRAPES) ×2 IMPLANT
STRIP CLOSURE SKIN 1/2X4 (GAUZE/BANDAGES/DRESSINGS) ×1 IMPLANT
SUCTION FRAZIER TIP 10 FR DISP (SUCTIONS) IMPLANT
SUT ETHILON 4 0 PS 2 18 (SUTURE) IMPLANT
SUT ETHILON 5 0 PS 2 18 (SUTURE) IMPLANT
SUT MERSILENE 4 0 P 3 (SUTURE) IMPLANT
SUT PROLENE 3 0 PS 2 (SUTURE) ×1 IMPLANT
SUT VIC AB 2-0 SH 27 (SUTURE) ×2
SUT VIC AB 2-0 SH 27XBRD (SUTURE) IMPLANT
SUT VIC AB 4-0 P-3 18XBRD (SUTURE) IMPLANT
SUT VIC AB 4-0 P3 18 (SUTURE)
SUT VICRYL 4-0 PS2 18IN ABS (SUTURE) ×1 IMPLANT
SUT VICRYL RAPIDE 4-0 (SUTURE) IMPLANT
SUT VICRYL RAPIDE 4/0 PS 2 (SUTURE) IMPLANT
SYR BULB 3OZ (MISCELLANEOUS) ×1 IMPLANT
SYRINGE 10CC LL (SYRINGE) IMPLANT
TOWEL OR 17X24 6PK STRL BLUE (TOWEL DISPOSABLE) ×2 IMPLANT
TUBE CONNECTING 20X1/4 (TUBING) IMPLANT
UNDERPAD 30X30 INCONTINENT (UNDERPADS AND DIAPERS) ×2 IMPLANT

## 2013-11-09 NOTE — Anesthesia Postprocedure Evaluation (Signed)
  Anesthesia Post-op Note  Patient: Darrell KollerKelly M Gahan  Procedure(s) Performed: Procedure(s): OPEN REDUCTION INTERNAL FIXATION (ORIF) OF LEFT SMALL FINGER METACARPAL FRACTURE     (Left)  Patient Location: PACU  Anesthesia Type:General and block  Level of Consciousness: awake and alert   Airway and Oxygen Therapy: Patient Spontanous Breathing  Post-op Pain: none  Post-op Assessment: Post-op Vital signs reviewed, Patient's Cardiovascular Status Stable and Respiratory Function Stable  Post-op Vital Signs: Reviewed  Filed Vitals:   11/09/13 1515  BP: 136/87  Pulse: 66  Temp:   Resp: 16    Complications: No apparent anesthesia complications

## 2013-11-09 NOTE — Op Note (Deleted)
NAME:  Darrell Wright, Darrell Wright                  ACCOUNT NO.:  [id number]  MEDICAL RECORD NO.:  [id number]  LOCATION:                               FACILITY:  [facility]  PHYSICIAN:  Kambria Grima A. Thiago Ragsdale, M.D.DATE OF BIRTH:  12/29/1975  DATE OF PROCEDURE:  11/09/2013 DATE OF DISCHARGE:  11/09/2013                              OPERATIVE REPORT   PREOPERATIVE DIAGNOSIS:  Displaced fracture, small metacarpal left hand.  POSTOPERATIVE DIAGNOSIS:  Displaced fracture, small metacarpal left hand.  PROCEDURE:  Open reduction and internal fixation of small metacarpal fracture.  SURGEON:  Ridley Dileo A. Barnes Florek, M.D.  ASSISTANT:  None.  ANESTHESIA:  Supraclavicular and general.  COMPLICATIONS:  No complication.  No drains.  DESCRIPTION OF PROCEDURE:  The patient was taken to the operating suite. After induction of adequate supraclavicular block, analgesia, and general and vascular general anesthetic, the left upper extremity prepped and draped in sterile fashion.  An Esmarch was used to exsanguinate the limb.  Tourniquet inflated to 250 mmHg.  At this point in time, incision was made over the 5th metacarpal, proximal to the middle third.  Skin was incised.  Interval between the EDC and EDQ was developed.  These tendons were retracted radially and ulnarly respectively.  Subperiosteal dissector of the fracture site, there was a 3 part fracture with a butterfly fragment.  We attached the butterfly fragment to the distal fragment with a 1.5 mm lag screw from ulnar radial.  We then took this combined fragment to the most proximal fragment from ulnar to radial in lag fashion with a 1.5 mm lag screw. We then took a 6-hole plate dorsally and placed 4 cortical screws, 2 proximal and 2 distal, to span the fracture site.  Intraoperative fluoroscopy revealed adequate reduction, AP, lateral, and oblique view. The wound was thoroughly irrigated.  The periosteum, soft tissues closed with 2-0 undyed Vicryl and the skin  with a 3-0 Prolene subcuticular stitch.  Steri-Strips, 4x4s, fluffs and ulnar gutter splint was applied. The patient tolerated procedure well, went to the recovery room in stable fashion.     Parthiv Mucci A. Angellynn Kimberlin, M.D.     MAW/MEDQ  D:  11/09/2013  T:  11/09/2013  Job:  [id number] 

## 2013-11-09 NOTE — Progress Notes (Signed)
Assisted Dr. Fitzgerald with left, ultrasound guided, supraclavicular block. Side rails up, monitors on throughout procedure. See vital signs in flow sheet. Tolerated Procedure well. 

## 2013-11-09 NOTE — Op Note (Signed)
See note [id number]

## 2013-11-09 NOTE — Discharge Instructions (Signed)

## 2013-11-09 NOTE — Op Note (Signed)
NAMMarland Kitchen:  Arabella MerlesFAIR, Colan                  ACCOUNT NO.:  [id number]  MEDICAL RECORD NO.:  [id number]  LOCATION:                               FACILITY:  [facility]  PHYSICIAN:  Artist PaisMatthew A. Cristabel Bicknell, M.D.DATE OF BIRTH:  11/03/1975  DATE OF PROCEDURE:  11/09/2013 DATE OF DISCHARGE:  11/09/2013                              OPERATIVE REPORT   PREOPERATIVE DIAGNOSIS:  Displaced fracture, small metacarpal left hand.  POSTOPERATIVE DIAGNOSIS:  Displaced fracture, small metacarpal left hand.  PROCEDURE:  Open reduction and internal fixation of small metacarpal fracture.  SURGEON:  Artist PaisMatthew A. Mina MarbleWeingold, M.D.  ASSISTANT:  None.  ANESTHESIA:  Supraclavicular and general.  COMPLICATIONS:  No complication.  No drains.  DESCRIPTION OF PROCEDURE:  The patient was taken to the operating suite. After induction of adequate supraclavicular block, analgesia, and general and vascular general anesthetic, the left upper extremity prepped and draped in sterile fashion.  An Esmarch was used to exsanguinate the limb.  Tourniquet inflated to 250 mmHg.  At this point in time, incision was made over the 5th metacarpal, proximal to the middle third.  Skin was incised.  Interval between the [place] and EDQ was developed.  These tendons were retracted radially and ulnarly respectively.  Subperiosteal dissector of the fracture site, there was a 3 part fracture with a butterfly fragment.  We attached the butterfly fragment to the distal fragment with a 1.5 mm lag screw from ulnar radial.  We then took this combined fragment to the most proximal fragment from ulnar to radial in lag fashion with a 1.5 mm lag screw. We then took a 6-hole plate dorsally and placed 4 cortical screws, 2 proximal and 2 distal, to span the fracture site.  Intraoperative fluoroscopy revealed adequate reduction, AP, lateral, and oblique view. The wound was thoroughly irrigated.  The periosteum, soft tissues closed with 2-0 undyed Vicryl and the skin  with a 3-0 Prolene subcuticular stitch.  Steri-Strips, 4x4s, fluffs and ulnar gutter splint was applied. The patient tolerated procedure well, went to the recovery room in stable fashion.     Artist PaisMatthew A. Mina MarbleWeingold, M.D.     MAW/MEDQ  D:  11/09/2013  T:  11/09/2013  Job:  [id number]

## 2013-11-09 NOTE — Anesthesia Preprocedure Evaluation (Addendum)
Anesthesia Evaluation  Patient identified by MRN, date of birth, ID band Patient awake    Reviewed: Allergy & Precautions, H&P , NPO status , Patient's Chart, lab work & pertinent test results  Airway Mallampati: II TM Distance: >3 FB Neck ROM: Full    Dental no notable dental hx. (+) Teeth Intact, Dental Advisory Given   Pulmonary Current Smoker,  breath sounds clear to auscultation  Pulmonary exam normal       Cardiovascular negative cardio ROS  Rhythm:Regular Rate:Normal     Neuro/Psych negative neurological ROS  negative psych ROS   GI/Hepatic negative GI ROS, Neg liver ROS,   Endo/Other  negative endocrine ROS  Renal/GU negative Renal ROS  negative genitourinary   Musculoskeletal   Abdominal   Peds  Hematology negative hematology ROS (+)   Anesthesia Other Findings   Reproductive/Obstetrics negative OB ROS                          Anesthesia Physical Anesthesia Plan  ASA: II  Anesthesia Plan: General and Regional   Post-op Pain Management:    Induction: Intravenous  Airway Management Planned: LMA  Additional Equipment:   Intra-op Plan:   Post-operative Plan: Extubation in OR  Informed Consent: I have reviewed the patients History and Physical, chart, labs and discussed the procedure including the risks, benefits and alternatives for the proposed anesthesia with the patient or authorized representative who has indicated his/her understanding and acceptance.   Dental advisory given  Plan Discussed with: CRNA  Anesthesia Plan Comments:         Anesthesia Quick Evaluation  

## 2013-11-09 NOTE — Anesthesia Procedure Notes (Addendum)
Anesthesia Regional Block:  Supraclavicular block  Pre-Anesthetic Checklist: ,, timeout performed, Correct Patient, Correct Site, Correct Laterality, Correct Procedure, Correct Position, site marked, Risks and benefits discussed, pre-op evaluation, post-op pain management  Laterality: Left  Prep: Maximum Sterile Barrier Precautions used and chloraprep       Needles:  Injection technique: Single-shot  Needle Type: Echogenic Stimulator Needle     Needle Length: 5cm 5 cm Needle Gauge: 22 and 22 G    Additional Needles:  Procedures: ultrasound guided (picture in chart) Supraclavicular block Narrative:  Start time: 11/09/2013 1:00 PM End time: 11/09/2013 1:08 PM Injection made incrementally with aspirations every 5 mL. Anesthesiologist: Fitzgerald,MD  Additional Notes: 2% Lidocaine skin wheel.    Procedure Name: LMA Insertion Date/Time: 11/09/2013 2:02 PM Performed by: Zenia ResidesPAYNE, Ardenia Stiner D Pre-anesthesia Checklist: Patient identified, Emergency Drugs available, Suction available and Patient being monitored Patient Re-evaluated:Patient Re-evaluated prior to inductionOxygen Delivery Method: Circle System Utilized Preoxygenation: Pre-oxygenation with 100% oxygen Intubation Type: IV induction Ventilation: Mask ventilation without difficulty LMA: LMA inserted LMA Size: 5.0 Number of attempts: 1 Airway Equipment and Method: bite block Placement Confirmation: positive ETCO2 Tube secured with: Tape Dental Injury: Teeth and Oropharynx as per pre-operative assessment

## 2013-11-09 NOTE — Transfer of Care (Signed)
Immediate Anesthesia Transfer of Care Note  Patient: Darrell Wright  Procedure(s) Performed: Procedure(s): OPEN REDUCTION INTERNAL FIXATION (ORIF) OF LEFT SMALL FINGER METACARPAL FRACTURE     (Left)  Patient Location: PACU  Anesthesia Type:General and Regional  Level of Consciousness: sedated  Airway & Oxygen Therapy: Patient Spontanous Breathing and Patient connected to face mask oxygen  Post-op Assessment: Report given to PACU RN and Post -op Vital signs reviewed and stable  Post vital signs: Reviewed and stable  Complications: No apparent anesthesia complications
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
## 2013-11-10 ENCOUNTER — Encounter (HOSPITAL_BASED_OUTPATIENT_CLINIC_OR_DEPARTMENT_OTHER): Payer: Self-pay | Admitting: Orthopedic Surgery

## 2013-11-10 LAB — WOUND CULTURE

## 2013-11-11 ENCOUNTER — Telehealth (HOSPITAL_COMMUNITY): Payer: Self-pay | Admitting: *Deleted

## 2013-11-11 NOTE — ED Notes (Signed)
Wound culture L forearm: Few MRSA.  Pt. treated with Keflex.  8/6 Message sent to Almedia BallsZach Baker PA.  8/7 Chart reviewed and I  noted that Dr. Mina MarbleWeingold took the pt. to surgery on 8/5, not sure if antibiotics were changed.  Lab shown to Dr. Lorenz CoasterKeller.  He said to call Dr. Ronie SpiesWeingold's office and tell him, since pt. is under his care now.  I called and spoke to [facility] PA and gave him pt.'s culture results.  He said he would take care of it. Vassie MoselleYork, Harvest Stanco M 11/11/2013

## 2013-11-11 NOTE — ED Notes (Signed)
Pt. called back. Pt. verified x 2 and given result. Pt. told that the Keflex is resistant to MRSA. He said Dr. Mina MarbleWeingold put him on sulfamethoxazole.  I told him it is sensitive to Sulfa drugs.  I [facility] the [facility] MRSA instructions with him. Pt. voiced understanding. Vassie MoselleYork, Shandiin Eisenbeis M 11/11/2013

## 2014-07-17 ENCOUNTER — Emergency Department (HOSPITAL_COMMUNITY)
Admission: EM | Admit: 2014-07-17 | Discharge: 2014-07-17 | Disposition: A | Discharge status: Home / Self Care | Payer: Self-pay | Attending: Emergency Medicine | Admitting: Emergency Medicine

## 2014-07-17 ENCOUNTER — Encounter (HOSPITAL_COMMUNITY): Payer: Self-pay | Admitting: Emergency Medicine

## 2014-07-17 DIAGNOSIS — J029 Acute pharyngitis, unspecified: Secondary | ICD-10-CM

## 2014-07-17 DIAGNOSIS — R05 Cough: Secondary | ICD-10-CM

## 2014-07-17 DIAGNOSIS — Z8781 Personal history of (healed) traumatic fracture: Secondary | ICD-10-CM | POA: Insufficient documentation

## 2014-07-17 DIAGNOSIS — Z87828 Personal history of other (healed) physical injury and trauma: Secondary | ICD-10-CM | POA: Insufficient documentation

## 2014-07-17 DIAGNOSIS — Z79899 Other long term (current) drug therapy: Secondary | ICD-10-CM | POA: Insufficient documentation

## 2014-07-17 DIAGNOSIS — R0981 Nasal congestion: Secondary | ICD-10-CM

## 2014-07-17 DIAGNOSIS — Z720 Tobacco use: Secondary | ICD-10-CM | POA: Insufficient documentation

## 2014-07-17 DIAGNOSIS — Z792 Long term (current) use of antibiotics: Secondary | ICD-10-CM | POA: Insufficient documentation

## 2014-07-17 DIAGNOSIS — R059 Cough, unspecified: Secondary | ICD-10-CM

## 2014-07-17 LAB — RAPID STREP SCREEN (MED CTR MEBANE ONLY): Streptococcus, Group A Screen (Direct): NEGATIVE

## 2014-07-17 MED ORDER — HYDROCOD POLST-CHLORPHEN POLST 10-8 MG/5ML PO LQCR
5.0000 mL | Freq: Two times a day (BID) | ORAL | Status: DC | PRN
Start: 2014-07-17 — End: 2017-07-22

## 2014-07-17 NOTE — ED Notes (Signed)
Pt c/o Fever and sore throat x 2 days. Pt temp is 98.7 at this time, sts he has not taken any OTC medications. Pt sts it's painful to swallow. A&Ox4. Ambulatory.

## 2014-07-17 NOTE — ED Notes (Signed)
Drink given to pt okayed by Misty StanleyLisa EDPA

## 2014-07-17 NOTE — ED Notes (Signed)
Pt reports sore throat with dry non-productive cough since Friday.  Not sure if he's had any fever since.  Pt in NAD.

## 2014-07-17 NOTE — Discharge Instructions (Signed)
Take the prescribed medication as directed.  May also use over the counter chloraseptic as needed for sore throat. Rest and drink plenty of fluids. Return to the ED for new or worsening symptoms.

## 2014-07-17 NOTE — ED Provider Notes (Signed)
CSN: [id number]     Arrival date & time 07/17/14  1704 History  This chart was scribed for Sharilyn Sites, PA-C, working with Doug Sou, MD by Jolene Provost, ED Scribe. This patient was seen in room WTR5/WTR5 and the patient's care was started at 5:19 PM.     Chief Complaint  Patient presents with  . Fever  . Sore Throat  . Cough   Patient is a 39 y.o. male presenting with fever, pharyngitis, and cough. The history is provided by the patient. No language interpreter was used.  Fever Associated symptoms: chills, congestion, cough, rhinorrhea and sore throat   Sore Throat  Cough Associated symptoms: chills, rhinorrhea and sore throat   Associated symptoms: no wheezing     HPI Comments: Darrell Wright is a 39 y.o. male who presents to the Emergency Department complaining of dry, hacking cough with associated sore throat for the last two days. Pt endorses associated rhinorrhea, subjective fever with chills, and decreased appetite.  No known sick contacts.  No chest pain or SOB.  No abdominal pain, nausea, vomiting, diarrhea.  Patient is a daily smoker.  No intervention tried PTA.  Past Medical History  Diagnosis Date  . Metacarpal bone fracture 11/03/2013    left small  . Abrasions of multiple sites 11/03/2013    knees  . Laceration of forearm, left 11/03/2013    sutured   Past Surgical History  Procedure Laterality Date  . No past surgeries    . Open reduction internal fixation (orif) metacarpal Left 11/09/2013    Procedure: OPEN REDUCTION INTERNAL FIXATION (ORIF) OF LEFT SMALL FINGER METACARPAL FRACTURE    ;  Surgeon: Dairl Ponder, MD;  Location: [facility];  Service: Orthopedics;  Laterality: Left;   No family history on file. History  Substance Use Topics  . Smoking status: Current Every Day Smoker -- 2 years    Types: Cigarettes  . Smokeless tobacco: Never Used     Comment: 3 cig./day  . Alcohol Use: No    Review of Systems  Constitutional: Positive for  chills.  HENT: Positive for congestion, rhinorrhea and sore throat.   Respiratory: Positive for cough. Negative for wheezing.   All other systems reviewed and are negative.   Allergies  Review of patient's allergies indicates no known allergies.  Home Medications   Prior to Admission medications   Medication Sig Start Date End Date Taking? Authorizing Provider  albuterol (PROVENTIL HFA;VENTOLIN HFA) 108 (90 BASE) MCG/ACT inhaler Inhale 2 puffs into the lungs every 6 (six) hours as needed for wheezing or shortness of breath.    Historical Provider, MD  amoxicillin (AMOXIL) 500 MG tablet Take 500 mg by mouth 4 (four) times daily.    Historical Provider, MD  cephALEXin (KEFLEX) 500 MG capsule Take 2 capsules (1,000 mg total) by mouth 3 (three) times daily. 11/08/13   Graylon Good, PA-C  oxyCODONE-acetaminophen (ROXICET) 5-325 MG per tablet Take 1-2 tablets by mouth every 4 (four) hours as needed for severe pain. 11/03/13   Nicole Pisciotta, PA-C  oxyCODONE-acetaminophen (ROXICET) 5-325 MG per tablet Take 1 tablet by mouth every 4 (four) hours as needed for severe pain. 11/09/13   Dairl Ponder, MD   BP 157/92 mmHg  Pulse 85  Temp(Src) 98.7 F (37.1 C) (Oral)  Resp 18  SpO2 99%   Physical Exam  Constitutional: He is oriented to person, place, and time. He appears well-developed and well-nourished. No distress.  HENT:  Head: Normocephalic and  atraumatic.  Right Ear: Tympanic membrane and ear canal normal.  Left Ear: Tympanic membrane and ear canal normal.  Nose: Mucosal edema and rhinorrhea (clear) present.  Mouth/Throat: Uvula is midline and mucous membranes are normal. Posterior oropharyngeal erythema present. No oropharyngeal exudate, posterior oropharyngeal edema or tonsillar abscesses.  Oropharynx slightly erythematous, tonsils normal in appearance bilaterally without exudate; uvula midline without peritonsillar abscess; handling secretions appropriately; no difficulty swallowing  or speaking  Eyes: Conjunctivae and EOM are normal. Pupils are equal, round, and reactive to light.  Neck: Normal range of motion. Neck supple.  Cardiovascular: Normal rate, regular rhythm and normal heart sounds.   Pulmonary/Chest: Effort normal and breath sounds normal. No respiratory distress. He has no wheezes.  Dry cough, respiratory effort normal, lungs clear bilaterally, O2 sats 99% on RA  Abdominal: Soft. Bowel sounds are normal.  Musculoskeletal: Normal range of motion.  Neurological: He is alert and oriented to person, place, and time. Coordination normal.  Skin: Skin is warm and dry. He is not diaphoretic.  Psychiatric: He has a normal mood and affect. His behavior is normal.  Nursing note and vitals reviewed.   ED Course  Procedures  DIAGNOSTIC STUDIES: Oxygen Saturation is 99% on RA, normal by my interpretation.    COORDINATION OF CARE: 5:23 PM Discussed treatment plan with pt at bedside and pt agreed to plan.  Labs Review Labs Reviewed  RAPID STREP SCREEN  CULTURE, GROUP A STREP    Imaging Review No results found.   EKG Interpretation None      MDM   Final diagnoses:  Cough  Sore throat  Nasal congestion   39-year-old male with subjective fever, sore throat, and dry cough for the past 3 days. No known sick contacts. On exam, patient is afebrile and nontoxic in appearance. He has slight oropharyngeal erythema without tonsillar edema or exudates. His lungs are clear and his vital signs are stable on room air. Rapid strep obtained which is negative. Constellation of symptoms likely viral in nature. Patient we discharged home with supportive care.  Discussed plan with patient, he/she acknowledged understanding and agreed with plan of care.  Return precautions given for new or worsening symptoms.  I personally performed the services described in this documentation, which was scribed in my presence. The recorded information has been reviewed and is  accurate.   Garlon HatchetLisa M Inesha Sow, PA-C 07/17/14 1858  Doug SouSam Jacubowitz, MD 07/17/14 [phone]

## 2014-07-20 LAB — CULTURE, GROUP A STREP: Strep A Culture: NEGATIVE

## 2014-09-28 ENCOUNTER — Encounter (HOSPITAL_COMMUNITY): Payer: Self-pay | Admitting: *Deleted

## 2014-09-28 ENCOUNTER — Emergency Department (HOSPITAL_COMMUNITY)
Admission: EM | Admit: 2014-09-28 | Discharge: 2014-09-28 | Disposition: A | Discharge status: Home / Self Care | Payer: Self-pay | Attending: Emergency Medicine | Admitting: Emergency Medicine

## 2014-09-28 ENCOUNTER — Emergency Department (HOSPITAL_COMMUNITY): Payer: Self-pay

## 2014-09-28 DIAGNOSIS — R112 Nausea with vomiting, unspecified: Secondary | ICD-10-CM | POA: Insufficient documentation

## 2014-09-28 DIAGNOSIS — R1084 Generalized abdominal pain: Secondary | ICD-10-CM | POA: Insufficient documentation

## 2014-09-28 DIAGNOSIS — R630 Anorexia: Secondary | ICD-10-CM | POA: Insufficient documentation

## 2014-09-28 DIAGNOSIS — Z87828 Personal history of other (healed) physical injury and trauma: Secondary | ICD-10-CM | POA: Insufficient documentation

## 2014-09-28 DIAGNOSIS — Z8781 Personal history of (healed) traumatic fracture: Secondary | ICD-10-CM | POA: Insufficient documentation

## 2014-09-28 LAB — CBC WITH DIFFERENTIAL/PLATELET
BASOS ABS: 0 10*3/uL (ref 0.0–0.1)
Basophils Relative: 0 % (ref 0–1)
Eosinophils Absolute: 0.1 10*3/uL (ref 0.0–0.7)
Eosinophils Relative: 2 % (ref 0–5)
HCT: 42.6 % (ref 39.0–52.0)
Hemoglobin: 14.6 g/dL (ref 13.0–17.0)
Lymphocytes Relative: 38 % (ref 12–46)
Lymphs Abs: 2.4 10*3/uL (ref 0.7–4.0)
MCH: 31 pg (ref 26.0–34.0)
MCHC: 34.3 g/dL (ref 30.0–36.0)
MCV: 90.4 fL (ref 78.0–100.0)
Monocytes Absolute: 0.3 10*3/uL (ref 0.1–1.0)
Monocytes Relative: 5 % (ref 3–12)
NEUTROS ABS: 3.6 10*3/uL (ref 1.7–7.7)
NEUTROS PCT: 55 % (ref 43–77)
Platelets: 307 10*3/uL (ref 150–400)
RBC: 4.71 MIL/uL (ref 4.22–5.81)
RDW: 12.3 % (ref 11.5–15.5)
WBC: 6.4 10*3/uL (ref 4.0–10.5)

## 2014-09-28 LAB — COMPREHENSIVE METABOLIC PANEL
ALBUMIN: 4.5 g/dL (ref 3.5–5.0)
ALK PHOS: 59 U/L (ref 38–126)
ALT: 23 U/L (ref 17–63)
AST: 26 U/L (ref 15–41)
Anion gap: 8 (ref 5–15)
BILIRUBIN TOTAL: 0.6 mg/dL (ref 0.3–1.2)
BUN: 9 mg/dL (ref 6–20)
CHLORIDE: 109 mmol/L (ref 101–111)
CO2: 24 mmol/L (ref 22–32)
Calcium: 9 mg/dL (ref 8.9–10.3)
Creatinine, Ser: 1.18 mg/dL (ref 0.61–1.24)
GFR calc Af Amer: >60 mL/min (ref >60)
GFR calc non Af Amer: >60 mL/min (ref >60)
GLUCOSE: 95 mg/dL (ref 65–99)
Potassium: 3.7 mmol/L (ref 3.5–5.1)
SODIUM: 141 mmol/L (ref 135–145)
TOTAL PROTEIN: 7.8 g/dL (ref 6.5–8.1)

## 2014-09-28 LAB — LIPASE, BLOOD: Lipase: 18 U/L — ABNORMAL LOW (ref 22–51)

## 2014-09-28 MED ORDER — ONDANSETRON 4 MG PO TBDP: ORAL_TABLET | ORAL | Status: DC

## 2014-09-28 MED ORDER — HYDROMORPHONE HCL 1 MG/ML IJ SOLN
1.0000 mg | Freq: Once | INTRAMUSCULAR | Status: AC
  Administered 2014-09-28: 1 mg via INTRAVENOUS
  Filled 2014-09-28: qty 1

## 2014-09-28 MED ORDER — IOHEXOL 300 MG/ML  SOLN
100.0000 mL | Freq: Once | INTRAMUSCULAR | Status: AC | PRN
  Administered 2014-09-28: 100 mL via INTRAVENOUS

## 2014-09-28 MED ORDER — IOHEXOL 300 MG/ML  SOLN
50.0000 mL | Freq: Once | INTRAMUSCULAR | Status: AC | PRN
  Administered 2014-09-28: 50 mL via ORAL

## 2014-09-28 MED ORDER — ONDANSETRON HCL 4 MG/2ML IJ SOLN
4.0000 mg | Freq: Once | INTRAMUSCULAR | Status: AC
  Administered 2014-09-28: 4 mg via INTRAVENOUS
  Filled 2014-09-28: qty 2

## 2014-09-28 NOTE — Discharge Instructions (Signed)

## 2014-09-28 NOTE — ED Notes (Signed)
CT completed

## 2014-09-28 NOTE — ED Provider Notes (Signed)
CSN: [id number]     Arrival date & time 09/28/14  8295 History   First MD Initiated Contact with Patient 09/28/14 [phone]     Chief Complaint  Patient presents with  . Abdominal Pain  . Flank Pain     (Consider location/radiation/quality/duration/timing/severity/associated sxs/prior Treatment) Patient is a 39 y.o. male presenting with abdominal pain.  Abdominal Pain Pain location:  Generalized Pain quality: sharp   Pain radiates to:  Does not radiate Pain severity:  Severe Onset quality:  Gradual Duration:  5 hours Timing:  Constant Progression:  Worsening Chronicity:  New Context: not alcohol use, not previous surgeries and not recent illness   Relieved by:  Nothing Worsened by:  Movement and palpation Ineffective treatments:  None tried Associated symptoms: anorexia, nausea and vomiting   Associated symptoms: no cough, no diarrhea and no fever     Past Medical History  Diagnosis Date  . Metacarpal bone fracture 11/03/2013    left small  . Abrasions of multiple sites 11/03/2013    knees  . Laceration of forearm, left 11/03/2013    sutured   Past Surgical History  Procedure Laterality Date  . No past surgeries    . Open reduction internal fixation (orif) metacarpal Left 11/09/2013    Procedure: OPEN REDUCTION INTERNAL FIXATION (ORIF) OF LEFT SMALL FINGER METACARPAL FRACTURE    ;  Surgeon: Dairl Ponder, MD;  Location: [facility];  Service: Orthopedics;  Laterality: Left;   History reviewed. No pertinent family history. History  Substance Use Topics  . Smoking status: Current Every Day Smoker -- 2 years    Types: Cigarettes  . Smokeless tobacco: Never Used     Comment: 3 cig./day  . Alcohol Use: No    Review of Systems  Constitutional: Negative for fever.  Respiratory: Negative for cough.   Gastrointestinal: Positive for nausea, vomiting, abdominal pain and anorexia. Negative for diarrhea.  All other systems reviewed and are  negative.     Allergies  Other  Home Medications   Prior to Admission medications   Medication Sig Start Date End Date Taking? Authorizing Provider  albuterol (PROVENTIL HFA;VENTOLIN HFA) 108 (90 BASE) MCG/ACT inhaler Inhale 2 puffs into the lungs every 6 (six) hours as needed for wheezing or shortness of breath.   Yes Historical Provider, MD  chlorpheniramine-HYDROcodone (TUSSIONEX PENNKINETIC ER) 10-8 MG/5ML LQCR Take 5 mLs by mouth every 12 (twelve) hours as needed for cough (Cough). Patient not taking: Reported on 09/28/2014 07/17/14   Garlon Hatchet, PA-C  ondansetron [facility] ODT) 4 MG disintegrating tablet  ODT q4 hours prn nausea/vomit 09/28/14   Mirian Mo, MD  oxyCODONE-acetaminophen (ROXICET) 5-325 MG per tablet Take 1-2 tablets by mouth every 4 (four) hours as needed for severe pain. Patient not taking: Reported on 07/17/2014 11/03/13   Joni Reining Pisciotta, PA-C   BP 176/101 mmHg  Pulse 91  Temp(Src) 98.2 F (36.8 C) (Oral)  Resp 16  SpO2 98% Physical Exam  Constitutional: He is oriented to person, place, and time. He appears well-developed and well-nourished.  HENT:  Head: Normocephalic and atraumatic.  Eyes: Conjunctivae and EOM are normal.  Neck: Normal range of motion. Neck supple.  Cardiovascular: Normal rate, regular rhythm and normal heart sounds.   Pulmonary/Chest: Effort normal and breath sounds normal. No respiratory distress.  Abdominal: He exhibits no distension. There is generalized tenderness. There is guarding. There is no rebound.  Musculoskeletal: Normal range of motion.  Neurological: He is alert and oriented to person, place,  and time.  Skin: Skin is warm and dry.  Vitals reviewed.   ED Course  Procedures (including critical care time) Labs Review Labs Reviewed  LIPASE, BLOOD - Abnormal; Notable for the following:    Lipase 18 (*)    All other components within normal limits  CBC WITH DIFFERENTIAL/PLATELET  COMPREHENSIVE METABOLIC PANEL   URINALYSIS, ROUTINE W REFLEX MICROSCOPIC (NOT AT [facility])    Imaging Review Ct Abdomen Pelvis W Contrast  09/28/2014   CLINICAL DATA:  Abdominal pain and vomiting for 1 day  EXAM: CT ABDOMEN AND PELVIS WITH CONTRAST  TECHNIQUE: Multidetector CT imaging of the abdomen and pelvis was performed using the standard protocol following bolus administration of intravenous contrast. Oral contrast was also administered.  CONTRAST:  84mL OMNIPAQUE IOHEXOL 300 MG/ML SOLN, OMNIPAQUE IOHEXOL 300 MG/ML SOLN  COMPARISON:  None.  FINDINGS: Lung bases are clear.  No focal liver lesions are identified. Gallbladder wall is not thickened. There is no biliary duct dilatation.  Spleen, pancreas, and right adrenal appear normal. There is an 8 x 8 mm left adrenal mass. Kidneys bilaterally show no mass or hydronephrosis. There is no renal or ureteral calculus on either side.  In the pelvis, the urinary bladder is largely decompressed. The wall of the urinary bladder is not appreciably thickened. There is no pelvic mass or pelvic fluid collection. There are scattered sigmoid diverticula without diverticulitis.  Appendix appears normal.  There is no bowel obstruction. No free air or portal venous air. There is no demonstrable ascites, adenopathy, or abscess in the abdomen or pelvis. There is no abdominal aortic aneurysm. There are no blastic or lytic bone lesions.  IMPRESSION: No renal or ureteral calculus.  No hydronephrosis.  Occasional sigmoid diverticula without diverticulitis. No bowel obstruction. No bowel wall thickening or mesenteric thickening. Appendix appears normal.  8 mm left adrenal mass. Statistically, this small adrenal lesion most likely represents an adenoma.   Electronically Signed   By: Bretta Bang III M.D.   On: 09/28/2014 11:02     EKG Interpretation None      MDM   Final diagnoses:  Non-intractable vomiting with nausea, vomiting of unspecified type    39 y.o. male without pertinent PMH  presents with abd pain as above.  States this happens after he eats seafood, and ate it last night.  Guarding on exam.    Dorna Bloom as above unremarkable.  Symptoms improved with narcotics, zofran.  Pt requested to leave prior to UA, no urinary symptoms, so feel this is reasonable.  DC home in stable condition  I have reviewed all laboratory and imaging studies if ordered as above  1. Non-intractable vomiting with nausea, vomiting of unspecified type         Mirian Mo, MD 09/28/14 1154

## 2014-09-28 NOTE — ED Notes (Addendum)
Pt reports he is allergic to shellfish, pt ate fish last night and is unsure if shrimp had been mixed in. Normally pts throat swells up if he eats shrimp. Pt reports he started vomiting this morning around 0400, reports bil flank pain and abd pain 9/10. Reports his throat feels sore. Denies dysuria.   Pt denies hx of HTN. Blood pressure checked several times.

## 2014-09-28 NOTE — ED Notes (Signed)
Encouraged patient to provide urine

## 2014-09-28 NOTE — ED Notes (Signed)
Patient refusing IV, states he is scared of needles

## 2014-09-28 NOTE — ED Notes (Addendum)
Patient wanted IV out-states he is ready to go home-refused urine

## 2014-09-28 NOTE — ED Notes (Signed)
Encouraged patient to drink CT contrast

## 2014-09-28 NOTE — Progress Notes (Signed)
CM spoke with pt who confirms self pay [facility] resident with no pcp.  CM discussed and provided written information for self pay pcps, discussed the importance of pcp vs EDP services for f/u care, www.needymeds.org, www.goodrx.com, discounted pharmacies and other [employer] such as [employer] , [employer], affordable care act,  [place] med assist, financial assistance, self pay dental services, [place] med assist, DSS and  health department  Reviewed resources for [employer] self pay pcps like [facility]maul, family medicine at [facility] [employer] [facility], [facility], [facility], local urgent care centers, [facility], [facility] family practice, general medical clinics, family services of the [place], [facility] urgent care plus others, medication resources, [facility] out patient pharmacies and housing Pt voiced understanding and appreciation of resources provided   Provided [facility] contact information Pt states he had signed up for affordable care act via his job and wants to retry on "Monday" Pt states he  attempted to completed process for orange card but did not have information needed

## 2017-07-19 ENCOUNTER — Emergency Department (HOSPITAL_COMMUNITY): Payer: No Typology Code available for payment source

## 2017-07-19 ENCOUNTER — Inpatient Hospital Stay (HOSPITAL_COMMUNITY): Payer: No Typology Code available for payment source | Admitting: Anesthesiology

## 2017-07-19 ENCOUNTER — Encounter (HOSPITAL_COMMUNITY): Payer: Self-pay | Admitting: Emergency Medicine

## 2017-07-19 ENCOUNTER — Other Ambulatory Visit: Payer: Self-pay

## 2017-07-19 ENCOUNTER — Inpatient Hospital Stay (HOSPITAL_COMMUNITY)
Admission: EM | Admit: 2017-07-19 | Discharge: 2017-07-22 | DRG: 957 | Disposition: A | Discharge status: Rehabilitation Facility | Payer: No Typology Code available for payment source | Attending: General Surgery | Admitting: General Surgery

## 2017-07-19 ENCOUNTER — Encounter (HOSPITAL_COMMUNITY): Admission: EM | Disposition: A | Discharge status: Rehabilitation Facility | Payer: Self-pay | Source: Home / Self Care

## 2017-07-19 DIAGNOSIS — S3729XA Other injury of bladder, initial encounter: Secondary | ICD-10-CM | POA: Diagnosis present

## 2017-07-19 DIAGNOSIS — R310 Gross hematuria: Secondary | ICD-10-CM | POA: Diagnosis present

## 2017-07-19 DIAGNOSIS — D62 Acute posthemorrhagic anemia: Secondary | ICD-10-CM | POA: Diagnosis present

## 2017-07-19 DIAGNOSIS — Z419 Encounter for procedure for purposes other than remedying health state, unspecified: Secondary | ICD-10-CM

## 2017-07-19 DIAGNOSIS — R402142 Coma scale, eyes open, spontaneous, at arrival to emergency department: Secondary | ICD-10-CM | POA: Diagnosis present

## 2017-07-19 DIAGNOSIS — Z91013 Allergy to seafood: Secondary | ICD-10-CM

## 2017-07-19 DIAGNOSIS — Z23 Encounter for immunization: Secondary | ICD-10-CM | POA: Diagnosis not present

## 2017-07-19 DIAGNOSIS — S32810A Multiple fractures of pelvis with stable disruption of pelvic ring, initial encounter for closed fracture: Secondary | ICD-10-CM | POA: Diagnosis present

## 2017-07-19 DIAGNOSIS — S32462A Displaced associated transverse-posterior fracture of left acetabulum, initial encounter for closed fracture: Secondary | ICD-10-CM | POA: Diagnosis present

## 2017-07-19 DIAGNOSIS — M792 Neuralgia and neuritis, unspecified: Secondary | ICD-10-CM

## 2017-07-19 DIAGNOSIS — T148XXA Other injury of unspecified body region, initial encounter: Secondary | ICD-10-CM

## 2017-07-19 DIAGNOSIS — S334XXA Traumatic rupture of symphysis pubis, initial encounter: Secondary | ICD-10-CM | POA: Diagnosis present

## 2017-07-19 DIAGNOSIS — S069X9A Unspecified intracranial injury with loss of consciousness of unspecified duration, initial encounter: Secondary | ICD-10-CM | POA: Diagnosis present

## 2017-07-19 DIAGNOSIS — F10129 Alcohol abuse with intoxication, unspecified: Secondary | ICD-10-CM | POA: Diagnosis present

## 2017-07-19 DIAGNOSIS — M2540 Effusion, unspecified joint: Secondary | ICD-10-CM

## 2017-07-19 DIAGNOSIS — S91115A Laceration without foreign body of left lesser toe(s) without damage to nail, initial encounter: Secondary | ICD-10-CM | POA: Diagnosis present

## 2017-07-19 DIAGNOSIS — Y92410 Unspecified street and highway as the place of occurrence of the external cause: Secondary | ICD-10-CM | POA: Diagnosis not present

## 2017-07-19 DIAGNOSIS — R402362 Coma scale, best motor response, obeys commands, at arrival to emergency department: Secondary | ICD-10-CM | POA: Diagnosis present

## 2017-07-19 DIAGNOSIS — F1010 Alcohol abuse, uncomplicated: Secondary | ICD-10-CM

## 2017-07-19 DIAGNOSIS — S3282XD Multiple fractures of pelvis without disruption of pelvic ring, subsequent encounter for fracture with routine healing: Secondary | ICD-10-CM | POA: Diagnosis not present

## 2017-07-19 DIAGNOSIS — N3289 Other specified disorders of bladder: Secondary | ICD-10-CM | POA: Diagnosis not present

## 2017-07-19 DIAGNOSIS — S3330XA Dislocation of unspecified parts of lumbar spine and pelvis, initial encounter: Secondary | ICD-10-CM

## 2017-07-19 DIAGNOSIS — S01511A Laceration without foreign body of lip, initial encounter: Secondary | ICD-10-CM | POA: Diagnosis present

## 2017-07-19 DIAGNOSIS — S27892A Contusion of other specified intrathoracic organs, initial encounter: Secondary | ICD-10-CM | POA: Diagnosis present

## 2017-07-19 DIAGNOSIS — K5903 Drug induced constipation: Secondary | ICD-10-CM

## 2017-07-19 DIAGNOSIS — S32409A Unspecified fracture of unspecified acetabulum, initial encounter for closed fracture: Secondary | ICD-10-CM

## 2017-07-19 DIAGNOSIS — S0181XA Laceration without foreign body of other part of head, initial encounter: Secondary | ICD-10-CM | POA: Diagnosis present

## 2017-07-19 DIAGNOSIS — F17210 Nicotine dependence, cigarettes, uncomplicated: Secondary | ICD-10-CM | POA: Diagnosis present

## 2017-07-19 DIAGNOSIS — Y908 Blood alcohol level of 240 mg/100 ml or more: Secondary | ICD-10-CM | POA: Diagnosis present

## 2017-07-19 DIAGNOSIS — S31000A Unspecified open wound of lower back and pelvis without penetration into retroperitoneum, initial encounter: Secondary | ICD-10-CM

## 2017-07-19 DIAGNOSIS — M62838 Other muscle spasm: Secondary | ICD-10-CM

## 2017-07-19 DIAGNOSIS — R402222 Coma scale, best verbal response, incomprehensible words, at arrival to emergency department: Secondary | ICD-10-CM | POA: Diagnosis present

## 2017-07-19 DIAGNOSIS — T07XXXA Unspecified multiple injuries, initial encounter: Secondary | ICD-10-CM

## 2017-07-19 HISTORY — PX: FACIAL LACERATION REPAIR: SHX6589

## 2017-07-19 HISTORY — PX: INSERTION OF TRACTION PIN: SHX6560

## 2017-07-19 LAB — PREPARE FRESH FROZEN PLASMA
UNIT DIVISION: 0
Unit division: 0

## 2017-07-19 LAB — I-STAT CHEM 8, ED
BUN: 11 mg/dL (ref 6–20)
CALCIUM ION: 1 mmol/L — AB (ref 1.15–1.40)
CHLORIDE: 104 mmol/L (ref 101–111)
Creatinine, Ser: 2 mg/dL — ABNORMAL HIGH (ref 0.61–1.24)
GLUCOSE: 123 mg/dL — AB (ref 65–99)
HCT: 40 % (ref 39.0–52.0)
Hemoglobin: 13.6 g/dL (ref 13.0–17.0)
Potassium: 3.4 mmol/L — ABNORMAL LOW (ref 3.5–5.1)
Sodium: 139 mmol/L (ref 135–145)
TCO2: 22 mmol/L (ref 22–32)

## 2017-07-19 LAB — BPAM FFP
BLOOD PRODUCT EXPIRATION DATE: 201904142359
Blood Product Expiration Date: 201904192359
ISSUE DATE / TIME: 201904141925
ISSUE DATE / TIME: 201904141925
UNIT TYPE AND RH: 6200
UNIT TYPE AND RH: 6200

## 2017-07-19 LAB — CBC
HCT: 38 % — ABNORMAL LOW (ref 39.0–52.0)
HEMOGLOBIN: 13.1 g/dL (ref 13.0–17.0)
MCH: 30.3 pg (ref 26.0–34.0)
MCHC: 34.5 g/dL (ref 30.0–36.0)
MCV: 87.8 fL (ref 78.0–100.0)
Platelets: 295 10*3/uL (ref 150–400)
RBC: 4.33 MIL/uL (ref 4.22–5.81)
RDW: 12.6 % (ref 11.5–15.5)
WBC: 13.3 10*3/uL — ABNORMAL HIGH (ref 4.0–10.5)

## 2017-07-19 LAB — PROTIME-INR
INR: 1.05
PROTHROMBIN TIME: 13.6 s (ref 11.4–15.2)

## 2017-07-19 LAB — COMPREHENSIVE METABOLIC PANEL
ALK PHOS: 53 U/L (ref 38–126)
ALT: 43 U/L (ref 17–63)
ANION GAP: 13 (ref 5–15)
AST: 57 U/L — ABNORMAL HIGH (ref 15–41)
Albumin: 3.7 g/dL (ref 3.5–5.0)
BILIRUBIN TOTAL: 0.6 mg/dL (ref 0.3–1.2)
BUN: 10 mg/dL (ref 6–20)
CALCIUM: 8.1 mg/dL — AB (ref 8.9–10.3)
CO2: 19 mmol/L — ABNORMAL LOW (ref 22–32)
Chloride: 105 mmol/L (ref 101–111)
Creatinine, Ser: 1.52 mg/dL — ABNORMAL HIGH (ref 0.61–1.24)
GFR, EST NON AFRICAN AMERICAN: 55 mL/min — AB (ref >60)
Glucose, Bld: 124 mg/dL — ABNORMAL HIGH (ref 65–99)
Potassium: 3.3 mmol/L — ABNORMAL LOW (ref 3.5–5.1)
Sodium: 137 mmol/L (ref 135–145)
TOTAL PROTEIN: 6.4 g/dL — AB (ref 6.5–8.1)

## 2017-07-19 LAB — CBG MONITORING, ED: Glucose-Capillary: 123 mg/dL — ABNORMAL HIGH (ref 65–99)

## 2017-07-19 LAB — I-STAT CG4 LACTIC ACID, ED: LACTIC ACID, VENOUS: 2.7 mmol/L — AB (ref 0.5–1.9)

## 2017-07-19 LAB — CDS SEROLOGY

## 2017-07-19 LAB — ETHANOL: ALCOHOL ETHYL (B): 300 mg/dL — AB (ref <10)

## 2017-07-19 LAB — ABO/RH: ABO/RH(D): O POS

## 2017-07-19 SURGERY — REPAIR, LACERATION, FACE
Anesthesia: General | Site: Face

## 2017-07-19 MED ORDER — PROPOFOL 10 MG/ML IV BOLUS
INTRAVENOUS | Status: AC
  Filled 2017-07-19: qty 20

## 2017-07-19 MED ORDER — FENTANYL CITRATE (PF) 100 MCG/2ML IJ SOLN
INTRAMUSCULAR | Status: AC
  Filled 2017-07-19: qty 2

## 2017-07-19 MED ORDER — MIDAZOLAM HCL 2 MG/2ML IJ SOLN
INTRAMUSCULAR | Status: AC
  Filled 2017-07-19: qty 2

## 2017-07-19 MED ORDER — DEXTROSE 5 % IV SOLN
INTRAVENOUS | Status: DC | PRN
  Administered 2017-07-19: 25 ug/min via INTRAVENOUS

## 2017-07-19 MED ORDER — BACITRACIN ZINC 500 UNIT/GM EX OINT
TOPICAL_OINTMENT | Freq: Two times a day (BID) | CUTANEOUS | Status: DC
  Administered 2017-07-20 – 2017-07-22 (×5): via TOPICAL
  Filled 2017-07-19: qty 28.35

## 2017-07-19 MED ORDER — SODIUM CHLORIDE 0.9 % IV SOLN
INTRAVENOUS | Status: DC | PRN
  Administered 2017-07-19 (×2): via INTRAVENOUS

## 2017-07-19 MED ORDER — FENTANYL CITRATE (PF) 250 MCG/5ML IJ SOLN
INTRAMUSCULAR | Status: AC
  Filled 2017-07-19: qty 5

## 2017-07-19 MED ORDER — TETANUS-DIPHTH-ACELL PERTUSSIS 5-2.5-18.5 LF-MCG/0.5 IM SUSP
0.5000 mL | Freq: Once | INTRAMUSCULAR | Status: AC
  Administered 2017-07-19: 0.5 mL via INTRAMUSCULAR
  Filled 2017-07-19: qty 0.5

## 2017-07-19 MED ORDER — 0.9 % SODIUM CHLORIDE (POUR BTL) OPTIME
TOPICAL | Status: DC | PRN
  Administered 2017-07-19: 1000 mL

## 2017-07-19 MED ORDER — MIDAZOLAM HCL 2 MG/2ML IJ SOLN
2.0000 mg | Freq: Once | INTRAMUSCULAR | Status: AC
  Administered 2017-07-19: 2 mg via INTRAVENOUS

## 2017-07-19 MED ORDER — CEFAZOLIN SODIUM-DEXTROSE 2-4 GM/100ML-% IV SOLN
INTRAVENOUS | Status: AC
  Filled 2017-07-19: qty 100

## 2017-07-19 MED ORDER — BACITRACIN ZINC 500 UNIT/GM EX OINT
TOPICAL_OINTMENT | CUTANEOUS | Status: AC
  Filled 2017-07-19: qty 28.35

## 2017-07-19 MED ORDER — ARTIFICIAL TEARS OPHTHALMIC OINT
TOPICAL_OINTMENT | OPHTHALMIC | Status: DC | PRN
  Administered 2017-07-19: 1 via OPHTHALMIC

## 2017-07-19 MED ORDER — IOPAMIDOL (ISOVUE-300) INJECTION 61%
100.0000 mL | Freq: Once | INTRAVENOUS | Status: AC | PRN
  Administered 2017-07-19: 100 mL via INTRAVENOUS

## 2017-07-19 MED ORDER — MIDAZOLAM HCL 2 MG/2ML IJ SOLN
INTRAMUSCULAR | Status: AC
Start: 2017-07-19 — End: 2017-07-20
  Filled 2017-07-19: qty 2

## 2017-07-19 MED ORDER — FENTANYL CITRATE (PF) 100 MCG/2ML IJ SOLN
50.0000 ug | INTRAMUSCULAR | Status: DC | PRN
Start: 2017-07-19 — End: 2017-07-22
  Administered 2017-07-19: 100 ug via INTRAVENOUS
  Administered 2017-07-19: 50 ug via INTRAVENOUS

## 2017-07-19 MED ORDER — LIDOCAINE HCL (CARDIAC) 20 MG/ML IV SOLN
INTRAVENOUS | Status: DC | PRN
  Administered 2017-07-19: 100 mg via INTRAVENOUS

## 2017-07-19 MED ORDER — SUCCINYLCHOLINE CHLORIDE 20 MG/ML IJ SOLN
INTRAMUSCULAR | Status: DC | PRN
  Administered 2017-07-19: 160 mg via INTRAVENOUS

## 2017-07-19 MED ORDER — ONDANSETRON HCL 4 MG/2ML IJ SOLN
INTRAMUSCULAR | Status: DC | PRN
  Administered 2017-07-19: 4 mg via INTRAVENOUS

## 2017-07-19 MED ORDER — PROPOFOL 10 MG/ML IV BOLUS
INTRAVENOUS | Status: DC | PRN
  Administered 2017-07-19: 200 mg via INTRAVENOUS

## 2017-07-19 SURGICAL SUPPLY — 44 items
BLADE SURG 15 STRL LF DISP TIS (BLADE) IMPLANT
BLADE SURG 15 STRL SS (BLADE)
BNDG GAUZE ELAST 4 BULKY (GAUZE/BANDAGES/DRESSINGS) ×2 IMPLANT
CANISTER SUCT 3000ML PPV (MISCELLANEOUS) ×4 IMPLANT
CLEANER TIP ELECTROSURG 2X2 (MISCELLANEOUS) ×4 IMPLANT
CORDS BIPOLAR (ELECTRODE) IMPLANT
COVER SURGICAL LIGHT HANDLE (MISCELLANEOUS) ×4 IMPLANT
DRAPE HALF SHEET 40X57 (DRAPES) IMPLANT
ELECT COATED BLADE 2.86 ST (ELECTRODE) ×4 IMPLANT
ELECT NDL TIP 2.8 STRL (NEEDLE) IMPLANT
ELECT NEEDLE TIP 2.8 STRL (NEEDLE) IMPLANT
ELECT REM PT RETURN 9FT ADLT (ELECTROSURGICAL) ×4
ELECTRODE REM PT RTRN 9FT ADLT (ELECTROSURGICAL) ×2 IMPLANT
EVACUATOR SILICONE 100CC (DRAIN) IMPLANT
GLOVE BIOGEL M 7.0 STRL (GLOVE) ×8 IMPLANT
GOWN STRL REUS W/ TWL LRG LVL3 (GOWN DISPOSABLE) ×4 IMPLANT
GOWN STRL REUS W/TWL LRG LVL3 (GOWN DISPOSABLE) ×8
KIT BASIN OR (CUSTOM PROCEDURE TRAY) ×4 IMPLANT
KIT TURNOVER KIT B (KITS) ×4 IMPLANT
LOCATOR NERVE 3 VOLT (DISPOSABLE) IMPLANT
NDL HYPO 25GX1X1/2 BEV (NEEDLE) IMPLANT
NEEDLE HYPO 25GX1X1/2 BEV (NEEDLE) IMPLANT
NS IRRIG 1000ML POUR BTL (IV SOLUTION) ×4 IMPLANT
PAD ARMBOARD 7.5X6 YLW CONV (MISCELLANEOUS) ×8 IMPLANT
PENCIL BUTTON HOLSTER BLD 10FT (ELECTRODE) ×4 IMPLANT
PIN 3.2MM (PIN) ×2 IMPLANT
STAPLER VISISTAT 35W (STAPLE) IMPLANT
SUT CHROMIC 4 0 P 3 18 (SUTURE) ×2 IMPLANT
SUT ETHILON 3 0 PS 1 (SUTURE) IMPLANT
SUT ETHILON 5 0 P 3 18 (SUTURE)
SUT ETHILON 5 0 PS 2 18 (SUTURE) ×2 IMPLANT
SUT NYLON ETHILON 5-0 P-3 1X18 (SUTURE) IMPLANT
SUT PLAIN 5 0 P 3 18 (SUTURE) IMPLANT
SUT SILK 2 0 PERMA HAND 18 BK (SUTURE) IMPLANT
SUT SILK 3 0 REEL (SUTURE) IMPLANT
SUT VIC AB 3-0 PS2 18 (SUTURE)
SUT VIC AB 3-0 PS2 18XBRD (SUTURE) IMPLANT
SUT VIC AB 4-0 P-3 18X BRD (SUTURE) IMPLANT
SUT VIC AB 4-0 P3 18 (SUTURE)
SUT VIC AB 5-0 PS2 18 (SUTURE) ×2 IMPLANT
SUT VICRYL ABS 5-0 PS5 18IN (SUTURE) IMPLANT
TOWEL OR 17X24 6PK STRL BLUE (TOWEL DISPOSABLE) ×4 IMPLANT
TRAY ENT MC OR (CUSTOM PROCEDURE TRAY) ×4 IMPLANT
WATER STERILE IRR 1000ML POUR (IV SOLUTION) ×4 IMPLANT
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
## 2017-07-19 NOTE — Progress Notes (Signed)
Orthopedic Tech Progress Note Patient Details:  Darrell Wright 04/06/1976 [id number]  Musculoskeletal Traction Type of Traction: Skeletal (Balanced Suspension) Traction Location: RLE Traction Weight: 20 lbs       [name], [name] 07/19/2017, 8:41 PM

## 2017-07-19 NOTE — ED Notes (Signed)
I Stat Lactic Acid results reported to Dr. Virgina NorfolkAdam Curatolo

## 2017-07-19 NOTE — ED Notes (Signed)
Portable chest pelvis °

## 2017-07-19 NOTE — ED Notes (Signed)
To or 

## 2017-07-19 NOTE — ED Notes (Signed)
Pelvic binder placed.
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
## 2017-07-19 NOTE — ED Notes (Signed)
Fentanyl 50mcg iv

## 2017-07-19 NOTE — Anesthesia Preprocedure Evaluation (Addendum)
Anesthesia Evaluation  Patient identified by MRN, date of birth, ID band  Reviewed: Allergy & Precautions, NPO status , Patient's Chart, lab work & pertinent test results  Airway Mallampati: II  TM Distance: >3 FB Neck ROM: Full    Dental no notable dental hx.    Pulmonary Current Smoker,    Pulmonary exam normal breath sounds clear to auscultation       Cardiovascular negative cardio ROS Normal cardiovascular exam Rhythm:Regular Rate:Normal     Neuro/Psych negative neurological ROS  negative psych ROS   GI/Hepatic negative GI ROS, Neg liver ROS,   Endo/Other  negative endocrine ROS  Renal/GU negative Renal ROS     Musculoskeletal negative musculoskeletal ROS (+)   Abdominal   Peds  Hematology negative hematology ROS (+)   Anesthesia Other Findings Patient opens eyes to name. I cannot get patient to answer questions or follow commands. Flailing about in bed.  Reproductive/Obstetrics negative OB ROS                            Anesthesia Physical Anesthesia Plan  ASA: II and emergent  Anesthesia Plan: General   Post-op Pain Management:    Induction: Intravenous, Rapid sequence and Cricoid pressure planned  PONV Risk Score and Plan: Ondansetron, Dexamethasone and Treatment may vary due to age or medical condition  Airway Management Planned: Oral ETT  Additional Equipment:   Intra-op Plan:   Post-operative Plan: Extubation in OR  Informed Consent: I have reviewed the patients History and Physical, chart, labs and discussed the procedure including the risks, benefits and alternatives for the proposed anesthesia with the patient or authorized representative who has indicated his/her understanding and acceptance.   Dental advisory given  Plan Discussed with: CRNA  Anesthesia Plan Comments:         Anesthesia Quick Evaluation

## 2017-07-19 NOTE — ED Notes (Signed)
Called main lab to see why labs results were not back. Lab states that thay were running labs now.

## 2017-07-19 NOTE — ED Notes (Signed)
Narcan 0.4 mg                                  Iv bobby rn

## 2017-07-19 NOTE — ED Notes (Signed)
Dr Everardo Pacificvarkey at the bedisde.
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
## 2017-07-19 NOTE — ED Notes (Signed)
bp 98/78 man
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
## 2017-07-19 NOTE — ED Notes (Signed)
The pt has  A through and through laceration to his bottom lip  Bleeding intermittent  Small puncture wound between his lt  Great and 2nd toe  Pelvic fracture
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
## 2017-07-19 NOTE — Transfer of Care (Signed)
Immediate Anesthesia Transfer of Care Note  Patient: Darrell [place]  Procedure(s) Performed: FACIAL LACERATION REPAIR (N/A Face) INSERTION OF TRACTION PIN (Left )  Patient Location: PACU  Anesthesia Type:General  Level of Consciousness: sedated and responds to stimulation  Airway & Oxygen Therapy: Patient Spontanous Breathing and Patient connected to face mask oxygen  Post-op Assessment: Report given to RN and Post -op Vital signs reviewed and stable  Post vital signs: Reviewed and stable  Last Vitals:  Vitals Value Taken Time  BP 130/85 07/19/2017 11:55 PM  Temp    Pulse 94 07/19/2017 11:57 PM  Resp 25 07/19/2017 11:57 PM  SpO2 100 % 07/19/2017 11:57 PM  Vitals shown include unvalidated device data.  Last Pain:  Vitals:   07/19/17 2053  PainSc: 10-Worst pain ever         Complications: No apparent anesthesia complications

## 2017-07-19 NOTE — ED Notes (Signed)
Dr  Annalee GentaShoemaker and dr Everardo Pacificvarkey called to see this pt  Dr Corliss Skainstsuei has spoken to both
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
## 2017-07-19 NOTE — Anesthesia Procedure Notes (Signed)
Procedure Name: Intubation Date/Time: 07/19/2017 10:15 PM Performed by: Darrell [place], Darrell Mcfaul M, CRNA Pre-anesthesia Checklist: Patient identified, Emergency Drugs available, Suction available, Patient being monitored and Timeout performed Patient Re-evaluated:Patient Re-evaluated prior to induction Oxygen Delivery Method: Circle system utilized Preoxygenation: Pre-oxygenation with 100% oxygen Induction Type: IV induction, Rapid sequence and Cricoid Pressure applied Laryngoscope Size: Glidescope and 4 Grade View: Grade I Tube type: Subglottic suction tube Tube size: 7.5 mm Number of attempts: 1 Airway Equipment and Method: Stylet Placement Confirmation: ETT inserted through vocal cords under direct vision,  positive ETCO2 and breath sounds checked- equal and bilateral Secured at: 22 cm Tube secured with: Tape Dental Injury: Teeth and Oropharynx as per pre-operative assessment and Bloody posterior oropharynx  Comments: Patient has bottom two lower teeth loose (noted prior to airway manipulation).  Old blood in oropharynx and above cords.
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
## 2017-07-19 NOTE — ED Triage Notes (Signed)
The pt received a glasgow of 12 on arrival here.  He was trying to talk after the narcan  Strong odor of alcohol on his breath speech  garbled

## 2017-07-20 ENCOUNTER — Encounter (HOSPITAL_COMMUNITY): Admission: EM | Disposition: A | Discharge status: Rehabilitation Facility | Payer: Self-pay | Source: Home / Self Care

## 2017-07-20 ENCOUNTER — Encounter (HOSPITAL_COMMUNITY): Payer: Self-pay

## 2017-07-20 ENCOUNTER — Inpatient Hospital Stay (HOSPITAL_COMMUNITY): Payer: No Typology Code available for payment source | Admitting: Certified Registered"

## 2017-07-20 ENCOUNTER — Inpatient Hospital Stay (HOSPITAL_COMMUNITY): Payer: No Typology Code available for payment source

## 2017-07-20 HISTORY — PX: BLADDER REPAIR: SHX6721

## 2017-07-20 HISTORY — PX: ORIF ACETABULAR FRACTURE: SHX5029

## 2017-07-20 HISTORY — PX: ORIF PELVIC FRACTURE: SHX2128

## 2017-07-20 LAB — CBC
HCT: 34.4 % — ABNORMAL LOW (ref 39.0–52.0)
HCT: 32.7 % — ABNORMAL LOW (ref 39.0–52.0)
Hemoglobin: 11.2 g/dL — ABNORMAL LOW (ref 13.0–17.0)
Hemoglobin: 10.7 g/dL — ABNORMAL LOW (ref 13.0–17.0)
MCH: 29.4 pg (ref 26.0–34.0)
MCH: 29.6 pg (ref 26.0–34.0)
MCHC: 32.6 g/dL (ref 30.0–36.0)
MCHC: 32.7 g/dL (ref 30.0–36.0)
MCV: 90.3 fL (ref 78.0–100.0)
MCV: 90.6 fL (ref 78.0–100.0)
PLATELETS: 294 10*3/uL (ref 150–400)
PLATELETS: 264 10*3/uL (ref 150–400)
RBC: 3.81 MIL/uL — ABNORMAL LOW (ref 4.22–5.81)
RBC: 3.61 MIL/uL — AB (ref 4.22–5.81)
RDW: 13.6 % (ref 11.5–15.5)
RDW: 13.6 % (ref 11.5–15.5)
WBC: 13.4 10*3/uL — AB (ref 4.0–10.5)
WBC: 10.3 10*3/uL (ref 4.0–10.5)

## 2017-07-20 LAB — HIV ANTIBODY (ROUTINE TESTING W REFLEX): HIV Screen 4th Generation wRfx: NONREACTIVE

## 2017-07-20 LAB — COMPREHENSIVE METABOLIC PANEL
ALT: 67 U/L — ABNORMAL HIGH (ref 17–63)
ANION GAP: 11 (ref 5–15)
AST: 144 U/L — AB (ref 15–41)
Albumin: 3.5 g/dL (ref 3.5–5.0)
Alkaline Phosphatase: 50 U/L (ref 38–126)
BUN: 10 mg/dL (ref 6–20)
CHLORIDE: 110 mmol/L (ref 101–111)
CO2: 17 mmol/L — ABNORMAL LOW (ref 22–32)
Calcium: 7.3 mg/dL — ABNORMAL LOW (ref 8.9–10.3)
Creatinine, Ser: 1.58 mg/dL — ABNORMAL HIGH (ref 0.61–1.24)
GFR, EST NON AFRICAN AMERICAN: 52 mL/min — AB (ref >60)
Glucose, Bld: 112 mg/dL — ABNORMAL HIGH (ref 65–99)
POTASSIUM: 4.6 mmol/L (ref 3.5–5.1)
Sodium: 138 mmol/L (ref 135–145)
Total Bilirubin: 0.4 mg/dL (ref 0.3–1.2)
Total Protein: 5.9 g/dL — ABNORMAL LOW (ref 6.5–8.1)

## 2017-07-20 LAB — SURGICAL PCR SCREEN
MRSA, PCR: NEGATIVE
STAPHYLOCOCCUS AUREUS: NEGATIVE

## 2017-07-20 LAB — APTT: APTT: 31 s (ref 24–36)

## 2017-07-20 LAB — LACTIC ACID, PLASMA: LACTIC ACID, VENOUS: 2.1 mmol/L — AB (ref 0.5–1.9)

## 2017-07-20 LAB — PROTIME-INR
INR: 1.03
PROTHROMBIN TIME: 13.4 s (ref 11.4–15.2)

## 2017-07-20 LAB — PREPARE RBC (CROSSMATCH)

## 2017-07-20 LAB — MRSA PCR SCREENING: MRSA BY PCR: NEGATIVE

## 2017-07-20 SURGERY — OPEN REDUCTION INTERNAL FIXATION (ORIF) ACETABULAR FRACTURE
Anesthesia: General | Site: Hip | Laterality: Right

## 2017-07-20 MED ORDER — SODIUM CHLORIDE 0.9 % IV SOLN
INTRAVENOUS | Status: DC | PRN
  Administered 2017-07-20: 14:00:00 via INTRAVENOUS

## 2017-07-20 MED ORDER — FOLIC ACID 1 MG PO TABS
1.0000 mg | ORAL_TABLET | Freq: Every day | ORAL | Status: DC
  Administered 2017-07-21 – 2017-07-22 (×2): 1 mg via ORAL
  Filled 2017-07-20 (×2): qty 1

## 2017-07-20 MED ORDER — HYDROMORPHONE HCL 2 MG/ML IJ SOLN
INTRAMUSCULAR | Status: AC
  Filled 2017-07-20: qty 1

## 2017-07-20 MED ORDER — ONDANSETRON 4 MG PO TBDP: 4.0000 mg | ORAL_TABLET | Freq: Four times a day (QID) | ORAL | Status: DC | PRN

## 2017-07-20 MED ORDER — PROPOFOL 10 MG/ML IV BOLUS
INTRAVENOUS | Status: DC | PRN
  Administered 2017-07-20: 50 mg via INTRAVENOUS
  Administered 2017-07-20: 200 mg via INTRAVENOUS

## 2017-07-20 MED ORDER — MIDAZOLAM HCL 5 MG/5ML IJ SOLN
INTRAMUSCULAR | Status: DC | PRN
  Administered 2017-07-20 (×2): 1 mg via INTRAVENOUS

## 2017-07-20 MED ORDER — ALBUMIN HUMAN 5 % IV SOLN
INTRAVENOUS | Status: DC | PRN
  Administered 2017-07-20 (×2): via INTRAVENOUS

## 2017-07-20 MED ORDER — ONDANSETRON HCL 4 MG/2ML IJ SOLN
INTRAMUSCULAR | Status: DC | PRN
  Administered 2017-07-20: 4 mg via INTRAVENOUS

## 2017-07-20 MED ORDER — FENTANYL CITRATE (PF) 250 MCG/5ML IJ SOLN
INTRAMUSCULAR | Status: AC
  Filled 2017-07-20: qty 5

## 2017-07-20 MED ORDER — VANCOMYCIN HCL 1000 MG IV SOLR
INTRAVENOUS | Status: AC
  Filled 2017-07-20: qty 1000

## 2017-07-20 MED ORDER — MIDAZOLAM HCL 2 MG/2ML IJ SOLN
INTRAMUSCULAR | Status: AC
  Filled 2017-07-20: qty 2

## 2017-07-20 MED ORDER — SUCCINYLCHOLINE CHLORIDE 200 MG/10ML IV SOSY
PREFILLED_SYRINGE | INTRAVENOUS | Status: AC
  Filled 2017-07-20: qty 10

## 2017-07-20 MED ORDER — SODIUM CHLORIDE 0.9 % IV SOLN
INTRAVENOUS | Status: DC
  Administered 2017-07-20 (×2): via INTRAVENOUS

## 2017-07-20 MED ORDER — PHENYLEPHRINE HCL 10 MG/ML IJ SOLN
INTRAMUSCULAR | Status: DC | PRN
  Administered 2017-07-20: 120 ug via INTRAVENOUS
  Administered 2017-07-20: 80 ug via INTRAVENOUS

## 2017-07-20 MED ORDER — ONDANSETRON HCL 4 MG/2ML IJ SOLN: 4.0000 mg | Freq: Four times a day (QID) | INTRAMUSCULAR | Status: DC | PRN

## 2017-07-20 MED ORDER — KETOROLAC TROMETHAMINE 30 MG/ML IJ SOLN
INTRAMUSCULAR | Status: AC
  Filled 2017-07-20: qty 1

## 2017-07-20 MED ORDER — PROMETHAZINE HCL 25 MG/ML IJ SOLN: 6.2500 mg | INTRAMUSCULAR | Status: DC | PRN

## 2017-07-20 MED ORDER — MORPHINE SULFATE (PF) 4 MG/ML IV SOLN: 1.0000 mg | INTRAVENOUS | Status: DC | PRN

## 2017-07-20 MED ORDER — METOCLOPRAMIDE HCL 5 MG/ML IJ SOLN: 5.0000 mg | Freq: Three times a day (TID) | INTRAMUSCULAR | Status: DC | PRN

## 2017-07-20 MED ORDER — CEFAZOLIN SODIUM-DEXTROSE 2-4 GM/100ML-% IV SOLN
2.0000 g | Freq: Once | INTRAVENOUS | Status: AC
  Administered 2017-07-20 (×2): 2 g via INTRAVENOUS
  Filled 2017-07-20 (×2): qty 100

## 2017-07-20 MED ORDER — ONDANSETRON HCL 4 MG PO TABS: 4.0000 mg | ORAL_TABLET | Freq: Four times a day (QID) | ORAL | Status: DC | PRN

## 2017-07-20 MED ORDER — ENOXAPARIN SODIUM 40 MG/0.4ML ~~LOC~~ SOLN
40.0000 mg | SUBCUTANEOUS | Status: DC
  Administered 2017-07-21 – 2017-07-22 (×2): 40 mg via SUBCUTANEOUS
  Filled 2017-07-20 (×2): qty 0.4

## 2017-07-20 MED ORDER — PROPOFOL 10 MG/ML IV BOLUS
INTRAVENOUS | Status: AC
  Filled 2017-07-20: qty 20

## 2017-07-20 MED ORDER — KETOROLAC TROMETHAMINE 30 MG/ML IJ SOLN
30.0000 mg | Freq: Once | INTRAMUSCULAR | Status: AC | PRN
  Administered 2017-07-20: 30 mg via INTRAVENOUS

## 2017-07-20 MED ORDER — SODIUM CHLORIDE 0.9 % IV SOLN: Freq: Once | INTRAVENOUS | Status: DC

## 2017-07-20 MED ORDER — ARTIFICIAL TEARS OPHTHALMIC OINT
TOPICAL_OINTMENT | OPHTHALMIC | Status: AC
  Filled 2017-07-20: qty 3.5

## 2017-07-20 MED ORDER — OXYCODONE HCL 5 MG PO TABS: 5.0000 mg | ORAL_TABLET | Freq: Once | ORAL | Status: DC | PRN

## 2017-07-20 MED ORDER — TOBRAMYCIN SULFATE 1.2 G IJ SOLR
INTRAMUSCULAR | Status: AC
  Filled 2017-07-20: qty 1.2

## 2017-07-20 MED ORDER — ENOXAPARIN SODIUM 40 MG/0.4ML ~~LOC~~ SOLN: 40.0000 mg | Freq: Every day | SUBCUTANEOUS | Status: DC

## 2017-07-20 MED ORDER — HYDROMORPHONE HCL 1 MG/ML IJ SOLN
1.0000 mg | INTRAMUSCULAR | Status: DC | PRN
  Administered 2017-07-20 – 2017-07-22 (×8): 1 mg via INTRAVENOUS
  Filled 2017-07-20 (×8): qty 1

## 2017-07-20 MED ORDER — LABETALOL HCL 5 MG/ML IV SOLN
INTRAVENOUS | Status: DC | PRN
  Administered 2017-07-20: 20 mg via INTRAVENOUS

## 2017-07-20 MED ORDER — PHENYLEPHRINE 40 MCG/ML (10ML) SYRINGE FOR IV PUSH (FOR BLOOD PRESSURE SUPPORT)
PREFILLED_SYRINGE | INTRAVENOUS | Status: AC
  Filled 2017-07-20: qty 10

## 2017-07-20 MED ORDER — LORAZEPAM 2 MG/ML IJ SOLN: 0.0000 mg | Freq: Four times a day (QID) | INTRAMUSCULAR | Status: AC

## 2017-07-20 MED ORDER — HYDROMORPHONE HCL 2 MG/ML IJ SOLN
0.2500 mg | INTRAMUSCULAR | Status: DC | PRN
  Administered 2017-07-20: 0.5 mg via INTRAVENOUS

## 2017-07-20 MED ORDER — LACTATED RINGERS IV SOLN
INTRAVENOUS | Status: DC
  Administered 2017-07-20: 50 mL/h via INTRAVENOUS

## 2017-07-20 MED ORDER — OXYCODONE HCL 5 MG PO TABS
10.0000 mg | ORAL_TABLET | Freq: Four times a day (QID) | ORAL | Status: DC | PRN
  Administered 2017-07-20 – 2017-07-21 (×2): 10 mg via ORAL
  Administered 2017-07-21 (×2): 15 mg via ORAL
  Administered 2017-07-22: 10 mg via ORAL
  Filled 2017-07-20: qty 2
  Filled 2017-07-20 (×2): qty 3
  Filled 2017-07-20 (×2): qty 2

## 2017-07-20 MED ORDER — SUGAMMADEX SODIUM 200 MG/2ML IV SOLN
INTRAVENOUS | Status: AC
  Filled 2017-07-20: qty 2

## 2017-07-20 MED ORDER — LIDOCAINE 2% (20 MG/ML) 5 ML SYRINGE
INTRAMUSCULAR | Status: AC
  Filled 2017-07-20: qty 5

## 2017-07-20 MED ORDER — ROCURONIUM BROMIDE 10 MG/ML (PF) SYRINGE
PREFILLED_SYRINGE | INTRAVENOUS | Status: AC
  Filled 2017-07-20: qty 5

## 2017-07-20 MED ORDER — DEXAMETHASONE SODIUM PHOSPHATE 10 MG/ML IJ SOLN
INTRAMUSCULAR | Status: AC
  Filled 2017-07-20: qty 1

## 2017-07-20 MED ORDER — LIDOCAINE HCL (CARDIAC) 20 MG/ML IV SOLN
INTRAVENOUS | Status: DC | PRN
  Administered 2017-07-20: 100 mg via INTRAVENOUS

## 2017-07-20 MED ORDER — ONDANSETRON HCL 4 MG/2ML IJ SOLN
INTRAMUSCULAR | Status: AC
  Filled 2017-07-20: qty 2

## 2017-07-20 MED ORDER — VANCOMYCIN HCL 1000 MG IV SOLR
INTRAVENOUS | Status: DC | PRN
  Administered 2017-07-20: 1000 mg

## 2017-07-20 MED ORDER — THIAMINE HCL 100 MG/ML IJ SOLN
100.0000 mg | Freq: Every day | INTRAMUSCULAR | Status: DC
  Administered 2017-07-20: 100 mg via INTRAVENOUS
  Filled 2017-07-20: qty 2

## 2017-07-20 MED ORDER — DEXAMETHASONE SODIUM PHOSPHATE 10 MG/ML IJ SOLN
INTRAMUSCULAR | Status: DC | PRN
  Administered 2017-07-20: 10 mg via INTRAVENOUS

## 2017-07-20 MED ORDER — LORAZEPAM 2 MG/ML IJ SOLN: 1.0000 mg | Freq: Four times a day (QID) | INTRAMUSCULAR | Status: DC | PRN

## 2017-07-20 MED ORDER — ROCURONIUM BROMIDE 100 MG/10ML IV SOLN
INTRAVENOUS | Status: DC | PRN
  Administered 2017-07-20: 30 mg via INTRAVENOUS
  Administered 2017-07-20: 20 mg via INTRAVENOUS
  Administered 2017-07-20: 30 mg via INTRAVENOUS
  Administered 2017-07-20: 50 mg via INTRAVENOUS
  Administered 2017-07-20: 70 mg via INTRAVENOUS

## 2017-07-20 MED ORDER — LORAZEPAM 1 MG PO TABS: 1.0000 mg | ORAL_TABLET | Freq: Four times a day (QID) | ORAL | Status: DC | PRN

## 2017-07-20 MED ORDER — FENTANYL CITRATE (PF) 100 MCG/2ML IJ SOLN
INTRAMUSCULAR | Status: DC | PRN
  Administered 2017-07-20: 200 ug via INTRAVENOUS
  Administered 2017-07-20: 150 ug via INTRAVENOUS
  Administered 2017-07-20 (×2): 50 ug via INTRAVENOUS
  Administered 2017-07-20: 100 ug via INTRAVENOUS
  Administered 2017-07-20: 50 ug via INTRAVENOUS
  Administered 2017-07-20: 100 ug via INTRAVENOUS
  Administered 2017-07-20 (×2): 50 ug via INTRAVENOUS

## 2017-07-20 MED ORDER — MEPERIDINE HCL 50 MG/ML IJ SOLN: 6.2500 mg | INTRAMUSCULAR | Status: DC | PRN

## 2017-07-20 MED ORDER — LORAZEPAM 2 MG/ML IJ SOLN: 0.0000 mg | Freq: Two times a day (BID) | INTRAMUSCULAR | Status: DC

## 2017-07-20 MED ORDER — LACTATED RINGERS IV SOLN
INTRAVENOUS | Status: DC | PRN
  Administered 2017-07-20 (×2): via INTRAVENOUS

## 2017-07-20 MED ORDER — CEFAZOLIN SODIUM-DEXTROSE 1-4 GM/50ML-% IV SOLN
1.0000 g | Freq: Three times a day (TID) | INTRAVENOUS | Status: AC
  Administered 2017-07-21 (×2): 1 g via INTRAVENOUS
  Filled 2017-07-20 (×3): qty 50

## 2017-07-20 MED ORDER — PHENYLEPHRINE HCL 10 MG/ML IJ SOLN
INTRAVENOUS | Status: DC | PRN
  Administered 2017-07-20: 60 ug/min via INTRAVENOUS

## 2017-07-20 MED ORDER — METOCLOPRAMIDE HCL 5 MG PO TABS
5.0000 mg | ORAL_TABLET | Freq: Three times a day (TID) | ORAL | Status: DC | PRN
  Filled 2017-07-20: qty 2

## 2017-07-20 MED ORDER — TOBRAMYCIN SULFATE 1.2 G IJ SOLR
INTRAMUSCULAR | Status: DC | PRN
  Administered 2017-07-20: 1.2 g

## 2017-07-20 MED ORDER — LACTATED RINGERS IV SOLN
INTRAVENOUS | Status: DC | PRN
  Administered 2017-07-20 (×2): via INTRAVENOUS

## 2017-07-20 MED ORDER — 0.9 % SODIUM CHLORIDE (POUR BTL) OPTIME
TOPICAL | Status: DC | PRN
  Administered 2017-07-20 (×2): 1000 mL

## 2017-07-20 MED ORDER — VITAMIN B-1 100 MG PO TABS
100.0000 mg | ORAL_TABLET | Freq: Every day | ORAL | Status: DC
  Administered 2017-07-21 – 2017-07-22 (×2): 100 mg via ORAL
  Filled 2017-07-20 (×2): qty 1

## 2017-07-20 MED ORDER — GABAPENTIN 600 MG PO TABS
300.0000 mg | ORAL_TABLET | Freq: Two times a day (BID) | ORAL | Status: DC
  Filled 2017-07-20 (×3): qty 0.5

## 2017-07-20 MED ORDER — OXYCODONE HCL 5 MG/5ML PO SOLN: 5.0000 mg | Freq: Once | ORAL | Status: DC | PRN

## 2017-07-20 MED ORDER — CEFAZOLIN SODIUM 1 G IJ SOLR
INTRAMUSCULAR | Status: AC
  Filled 2017-07-20: qty 20

## 2017-07-20 MED ORDER — METHOCARBAMOL 500 MG PO TABS
750.0000 mg | ORAL_TABLET | Freq: Four times a day (QID) | ORAL | Status: DC
  Administered 2017-07-20 – 2017-07-22 (×7): 750 mg via ORAL
  Filled 2017-07-20 (×7): qty 2

## 2017-07-20 MED ORDER — SODIUM CHLORIDE 0.9 % IR SOLN
Status: DC | PRN
  Administered 2017-07-20: 3000 mL

## 2017-07-20 MED ORDER — SUGAMMADEX SODIUM 200 MG/2ML IV SOLN
INTRAVENOUS | Status: DC | PRN
  Administered 2017-07-20: 200 mg via INTRAVENOUS

## 2017-07-20 MED ORDER — ADULT MULTIVITAMIN W/MINERALS CH
1.0000 | ORAL_TABLET | Freq: Every day | ORAL | Status: DC
  Administered 2017-07-21 – 2017-07-22 (×2): 1 via ORAL
  Filled 2017-07-20 (×2): qty 1

## 2017-07-20 MED ORDER — DOCUSATE SODIUM 100 MG PO CAPS
100.0000 mg | ORAL_CAPSULE | Freq: Two times a day (BID) | ORAL | Status: DC
  Administered 2017-07-20 – 2017-07-22 (×4): 100 mg via ORAL
  Filled 2017-07-20 (×4): qty 1

## 2017-07-20 MED ORDER — ORAL CARE MOUTH RINSE
15.0000 mL | Freq: Two times a day (BID) | OROMUCOSAL | Status: DC
  Administered 2017-07-20 – 2017-07-22 (×6): 15 mL via OROMUCOSAL

## 2017-07-20 MED ORDER — ACETAMINOPHEN 325 MG PO TABS
650.0000 mg | ORAL_TABLET | Freq: Four times a day (QID) | ORAL | Status: DC
  Administered 2017-07-20 – 2017-07-22 (×7): 650 mg via ORAL
  Filled 2017-07-20 (×7): qty 2

## 2017-07-20 SURGICAL SUPPLY — 92 items
APPLIER CLIP 11 MED OPEN (CLIP) ×5
APR CLP MED 11 20 MLT OPN (CLIP) ×3
BIT DRILL 4.9 CANNULATED (BIT) ×3
BIT DRILL 5.6 (BIT) IMPLANT
BIT DRILL AO MATTA 2.5MX230M (BIT) IMPLANT
BIT DRILL CANN QC 4.9 LRG (BIT) IMPLANT
BIT DRILL SCALD PELVS II 2.5MM (BIT) IMPLANT
BIT DRILL STEP 3.5 (DRILL) IMPLANT
BLADE CLIPPER SURG (BLADE) IMPLANT
BRUSH SCRUB SURG 4.25 DISP (MISCELLANEOUS) ×10 IMPLANT
CLIP APPLIE 11 MED OPEN (CLIP) IMPLANT
CLOSURE WOUND 1/2 X4 (GAUZE/BANDAGES/DRESSINGS) ×1
COVER SURGICAL LIGHT HANDLE (MISCELLANEOUS) ×5 IMPLANT
DRAIN CHANNEL 15F RND FF W/TCR (WOUND CARE) IMPLANT
DRAPE C-ARM 42X72 X-RAY (DRAPES) ×5 IMPLANT
DRAPE C-ARMOR (DRAPES) ×5 IMPLANT
DRAPE INCISE IOBAN 66X45 STRL (DRAPES) ×7 IMPLANT
DRAPE INCISE IOBAN 85X60 (DRAPES) ×5 IMPLANT
DRAPE LAPAROTOMY TRNSV 102X78 (DRAPE) ×5 IMPLANT
DRAPE ORTHO SPLIT 77X108 STRL (DRAPES) ×10
DRAPE SURG 17X23 STRL (DRAPES) ×5 IMPLANT
DRAPE SURG ORHT 6 SPLT 77X108 (DRAPES) ×6 IMPLANT
DRAPE U-SHAPE 47X51 STRL (DRAPES) ×5 IMPLANT
DRILL 5.6 (BIT) ×5
DRILL BIT AO MATTA 2.5MX230M (BIT) ×5
DRILL BIT CANNULATED 4.9 (BIT) ×5
DRILL SCALED PELVIS II 2.5MM (BIT) ×5
DRILL STEP 3.5 (DRILL)
DRSG MEPILEX BORDER 4X12 (GAUZE/BANDAGES/DRESSINGS) IMPLANT
DRSG MEPILEX BORDER 4X4 (GAUZE/BANDAGES/DRESSINGS) ×6 IMPLANT
DRSG MEPILEX BORDER 4X8 (GAUZE/BANDAGES/DRESSINGS) IMPLANT
ELECT BLADE 6.5 EXT (BLADE) ×5 IMPLANT
ELECT REM PT RETURN 9FT ADLT (ELECTROSURGICAL) ×5
ELECTRODE REM PT RTRN 9FT ADLT (ELECTROSURGICAL) ×3 IMPLANT
EVACUATOR SILICONE 100CC (DRAIN) IMPLANT
GLOVE BIO SURGEON STRL SZ7.5 (GLOVE) ×5 IMPLANT
GLOVE BIO SURGEON STRL SZ8 (GLOVE) ×5 IMPLANT
GLOVE BIOGEL PI IND STRL 7.5 (GLOVE) ×3 IMPLANT
GLOVE BIOGEL PI IND STRL 8 (GLOVE) ×3 IMPLANT
GLOVE BIOGEL PI INDICATOR 7.5 (GLOVE) ×2
GLOVE BIOGEL PI INDICATOR 8 (GLOVE) ×2
GOWN STRL REUS W/ TWL LRG LVL3 (GOWN DISPOSABLE) ×6 IMPLANT
GOWN STRL REUS W/ TWL XL LVL3 (GOWN DISPOSABLE) ×6 IMPLANT
GOWN STRL REUS W/TWL LRG LVL3 (GOWN DISPOSABLE) ×10
GOWN STRL REUS W/TWL XL LVL3 (GOWN DISPOSABLE) ×15
GUIDEWIRE THRD ASNIS 3.2X300 (WIRE) ×4 IMPLANT
HANDPIECE INTERPULSE COAX TIP (DISPOSABLE) ×5
KIT BASIN OR (CUSTOM PROCEDURE TRAY) ×5 IMPLANT
KIT TURNOVER KIT B (KITS) ×5 IMPLANT
MANIFOLD NEPTUNE II (INSTRUMENTS) ×5 IMPLANT
NS IRRIG 1000ML POUR BTL (IV SOLUTION) ×10 IMPLANT
PACK TOTAL JOINT (CUSTOM PROCEDURE TRAY) ×5 IMPLANT
PAD ARMBOARD 7.5X6 YLW CONV (MISCELLANEOUS) ×10 IMPLANT
PLATE SUPRAPECTINEAL L (Plate) ×2 IMPLANT
PLATE SYMPHYSIS 92.5M 6H (Plate) ×2 IMPLANT
PREVENA INCISION MGT 90 150 (MISCELLANEOUS) ×2 IMPLANT
RETRACTOR YANK SUCT EIGR SABER (INSTRUMENTS) ×4 IMPLANT
RETRIEVER SUT HEWSON (MISCELLANEOUS) ×5 IMPLANT
SCREW 3.5X46MM (Screw) ×2 IMPLANT
SCREW CANN ASNIS 8X150MM (Screw) ×2 IMPLANT
SCREW CORTEX ST MATTA 3.5X22MM (Screw) ×4 IMPLANT
SCREW CORTEX ST MATTA 3.5X28MM (Screw) ×4 IMPLANT
SCREW CORTEX ST MATTA 3.5X30MM (Screw) ×2 IMPLANT
SCREW CORTEX ST MATTA 3.5X34MM (Screw) ×2 IMPLANT
SCREW CORTEX ST MATTA 3.5X36MM (Screw) ×6 IMPLANT
SCREW CORTEX ST MATTA 3.5X38M (Screw) ×4 IMPLANT
SCREW CORTEX ST MATTA 3.5X40MM (Screw) ×2 IMPLANT
SCREW CORTEX ST MATTA 3.5X50MM (Screw) ×4 IMPLANT
SCREW CORTEX ST MATTA 3.5X65MM (Screw) ×4 IMPLANT
SCREW CORTEX ST MATTA 3.5X70MM (Screw) ×2 IMPLANT
SET HNDPC FAN SPRY TIP SCT (DISPOSABLE) ×3 IMPLANT
SPONGE LAP 18X18 X RAY DECT (DISPOSABLE) IMPLANT
STAPLER VISISTAT 35W (STAPLE) ×5 IMPLANT
STRIP CLOSURE SKIN 1/2X4 (GAUZE/BANDAGES/DRESSINGS) ×4 IMPLANT
SUCTION FRAZIER HANDLE 10FR (MISCELLANEOUS) ×2
SUCTION TUBE FRAZIER 10FR DISP (MISCELLANEOUS) ×3 IMPLANT
SUT ETHILON 2 0 PSLX (SUTURE) ×10 IMPLANT
SUT FIBERWIRE #2 38 T-5 BLUE (SUTURE) ×10
SUT VIC AB 0 CT1 27 (SUTURE) ×10
SUT VIC AB 0 CT1 27XBRD ANBCTR (SUTURE) ×6 IMPLANT
SUT VIC AB 1 CT1 18XCR BRD 8 (SUTURE) ×6 IMPLANT
SUT VIC AB 1 CT1 27 (SUTURE) ×5
SUT VIC AB 1 CT1 27XBRD ANBCTR (SUTURE) ×3 IMPLANT
SUT VIC AB 1 CT1 8-18 (SUTURE) ×10
SUT VIC AB 2-0 CT1 27 (SUTURE) ×10
SUT VIC AB 2-0 CT1 TAPERPNT 27 (SUTURE) ×6 IMPLANT
SUTURE FIBERWR #2 38 T-5 BLUE (SUTURE) ×6 IMPLANT
TOWEL OR 17X24 6PK STRL BLUE (TOWEL DISPOSABLE) ×10 IMPLANT
TOWEL OR 17X26 10 PK STRL BLUE (TOWEL DISPOSABLE) ×10 IMPLANT
TRAY FOLEY W/METER SILVER 16FR (SET/KITS/TRAYS/PACK) IMPLANT
WASHER SCREW MATTA SS 13.0X1.5 (Washer) ×4 IMPLANT
WATER STERILE IRR 1000ML POUR (IV SOLUTION) ×20 IMPLANT

## 2017-07-20 NOTE — Anesthesia Postprocedure Evaluation (Signed)
Anesthesia Post Note  Patient: Darrell Wright  Procedure(s) Performed: FACIAL LACERATION REPAIR (N/A Face) INSERTION OF TRACTION PIN (Left )     Patient location during evaluation: PACU Anesthesia Type: General Level of consciousness: sedated and patient cooperative Pain management: pain level controlled Vital Signs Assessment: post-procedure vital signs reviewed and stable Respiratory status: spontaneous breathing Cardiovascular status: stable Anesthetic complications: no    Last Vitals:  Vitals:   07/20/17 0600 07/20/17 0700  BP: 126/70 117/72  Pulse: (!) 111 (!) 110  Resp: 15 13  Temp:    SpO2: 98% 98%    Last Pain:  Vitals:   07/20/17 0545  TempSrc:   PainSc: 9                  [name]

## 2017-07-20 NOTE — Transfer of Care (Signed)
Immediate Anesthesia Transfer of Care Note  Patient: Darrell KollerKelly M Bartko  Procedure(s) Performed: OPEN REDUCTION INTERNAL FIXATION (ORIF) ACETABULAR FRACTURE (Left Hip) OPEN REDUCTION INTERNAL FIXATION (ORIF) PELVIC FRACTURE (Right Hip) REPAIR OF BLADDER RUPTURE (N/A Bladder)  Patient Location: PACU  Anesthesia Type:General  Level of Consciousness: sedated, drowsy, patient cooperative and responds to stimulation  Airway & Oxygen Therapy: Patient Spontanous Breathing and Patient connected to nasal cannula oxygen  Post-op Assessment: Report given to RN, Post -op Vital signs reviewed and stable and Patient moving all extremities X 4  Post vital signs: Reviewed and stable  Last Vitals:  Vitals Value Taken Time  BP    Temp    Pulse    Resp    SpO2      Last Pain:  Vitals:   07/20/17 1054  TempSrc:   PainSc: Asleep         Complications: No apparent anesthesia complications

## 2017-07-20 NOTE — Anesthesia Postprocedure Evaluation (Signed)
Anesthesia Post Note  Patient: Elizebeth KollerKelly M Cummings  Procedure(s) Performed: OPEN REDUCTION INTERNAL FIXATION (ORIF) ACETABULAR FRACTURE (Left Hip) OPEN REDUCTION INTERNAL FIXATION (ORIF) PELVIC FRACTURE (Right Hip) REPAIR OF BLADDER RUPTURE (N/A Bladder)     Patient location during evaluation: PACU Anesthesia Type: General Level of consciousness: awake Pain management: pain level controlled Vital Signs Assessment: post-procedure vital signs reviewed and stable Respiratory status: spontaneous breathing, nonlabored ventilation, respiratory function stable and patient connected to nasal cannula oxygen Cardiovascular status: blood pressure returned to baseline and stable Postop Assessment: no apparent nausea or vomiting Anesthetic complications: no    Last Vitals:  Vitals:   07/20/17 2200 07/20/17 2300  BP: 129/84 134/84  Pulse:  92  Resp: (!) 22 15  Temp:    SpO2: 96% 97%    Last Pain:  Vitals:   07/20/17 2304  TempSrc:   PainSc: Asleep                 Ryan P Ellender

## 2017-07-20 NOTE — Plan of Care (Signed)
Care Plan updated for shift. Dicie BeamFrazier, Jacquelinne Speak RN BSN.
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
## 2017-07-20 NOTE — Progress Notes (Signed)
Orthopedic Tech Progress Note Patient Details:  Elizebeth KollerKelly M Fils 03/29/1976 [id number]  Patient ID: Elizebeth KollerKelly M Kaley, male   DOB: 02/08/1976, 42 y.o.   MRN: [id number] Applied ohf.  Trinna PostMartinez, Amori Colomb J 07/20/2017, 10:53 PM

## 2017-07-20 NOTE — Anesthesia Procedure Notes (Signed)
Procedure Name: Intubation Date/Time: 07/20/2017 1:39 PM Performed by: Lanell MatarBaker, Tarica Harl M, CRNA Pre-anesthesia Checklist: Patient identified, Emergency Drugs available, Suction available and Patient being monitored Patient Re-evaluated:Patient Re-evaluated prior to induction Oxygen Delivery Method: Circle System Utilized Preoxygenation: Pre-oxygenation with 100% oxygen Induction Type: IV induction Ventilation: Mask ventilation without difficulty Laryngoscope Size: Miller and 2 Grade View: Grade I Tube type: Oral Tube size: 7.5 mm Number of attempts: 1 Airway Equipment and Method: Stylet and Oral airway Placement Confirmation: ETT inserted through vocal cords under direct vision,  positive ETCO2 and breath sounds checked- equal and bilateral Secured at: 23 cm Tube secured with: Tape Dental Injury: Teeth and Oropharynx as per pre-operative assessment
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
## 2017-07-20 NOTE — Progress Notes (Signed)
Patient gave RN number for fiance, Marcelino DusterMichelle. Requesting staff to call and update fiance. Number handed off to CSW. Will continue to monitor. Briant CedarFraizer, Vence Lalor RN BSN.
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
## 2017-07-20 NOTE — Anesthesia Procedure Notes (Signed)
Arterial Line Insertion Start/End4/15/2019 2:00 PM Performed by: Dorie RankQuinn, Eliyohu Class M, CRNA, CRNA  Preanesthetic checklist: patient identified, IV checked, site marked, risks and benefits discussed, surgical consent, monitors and equipment checked, pre-op evaluation and timeout performed Lidocaine 1% used for infiltration Left, radial was placed Catheter size: 20 G Hand hygiene performed , maximum sterile barriers used  and Seldinger technique used Allen's test indicative of satisfactory collateral circulation Attempts: 1 Procedure performed without using ultrasound guided technique. Ultrasound Notes:anatomy identified Following insertion, Biopatch and dressing applied. Post procedure assessment: normal

## 2017-07-20 NOTE — Brief Op Note (Signed)
07/19/2017 - 07/20/2017  7:51 PM  PATIENT:  Darrell Wright  42 y.o. male  PRE-OPERATIVE DIAGNOSIS:   1. RIGHT HEMIPELVIS DISLOCATION 2. LEFT TRANSVERSE POSTERIOR WALL ACETABULAR FRACTURE 3. POSSIBLE BLADDER TEAR 4. RIGHT TIBIAL TRACTION PIN  POST-OPERATIVE DIAGNOSIS:  1. RIGHT HEMIPELVIS DISLOCATION 2. LEFT TRANSVERSE POSTERIOR WALL ACETABULAR FRACTURE 3. BLADDER TEAR 4. RIGHT TIBIAL TRACTION PIN  PROCEDURE:  Procedure(s): 1. OPEN REDUCTION INTERNAL FIXATION (ORIF) ACETABULAR FRACTURE (Left), TRANSVERSE POSTERIOR WALL 2. OPEN REDUCTION INTERNAL FIXATION (ORIF) PUBIC SYMPHYSIS (Right) 3. RIGHT AND LEFT SACROILIAC SCREW FIXATION 4. REPAIR OF BLADDER RUPTURE (by Dr. Annabell HowellsWrenn) 5. REMOVAL OF TRACTION PIN RIGHT FEMUR 6. INCISIONAL WOUND VAC  SURGEON:  Surgeon(s) and Role: Panel 1:    Myrene Galas* Jesson Foskey, MD - Primary Panel 2:    Bjorn Pippin* Wrenn, John, MD - Primary  PHYSICIAN ASSISTANT: 1. KEITH PAUL, PA-C, 2. PA Student  ANESTHESIA:   general  EBL:  450 mL   BLOOD ADMINISTERED:none  DRAINS: Incisional wound vac   LOCAL MEDICATIONS USED:  NONE  SPECIMEN:  No Specimen  DISPOSITION OF SPECIMEN:  N/A  COUNTS:  YES  TOURNIQUET:  * No tourniquets in log *  DICTATION: .Other Dictation: Dictation Number [id number]  PLAN OF CARE: Admit to inpatient   PATIENT DISPOSITION:  PACU - hemodynamically stable.   Delay start of Pharmacological VTE agent (>24hrs) due to surgical blood loss or risk of bleeding: no

## 2017-07-20 NOTE — OR Nursing (Signed)
Dr. Annabell HowellsWrenn (Urology) was called during Dr. Magdalene PatriciaHandy's procedure for emergency exploration of  bladder rupture.  Before Dr. Annabell HowellsWrenn started, I asked if he was OK with skipping the emergency instrument count and have a portable XRay done instead.  Dr. Carola FrostHandy and Dr. Annabell HowellsWrenn were fine with this decision.

## 2017-07-20 NOTE — Progress Notes (Signed)
Report called to Short stay. Patient transported to OR with Tammy RN. Dicie BeamFrazier, Aolani Piggott RN BSN.

## 2017-07-20 NOTE — Progress Notes (Signed)
D; tetanus shot given to Rt Deltoid, unable to answer the questions due to mental status.

## 2017-07-20 NOTE — Progress Notes (Signed)
Pt continues to refuse for staff to notify family of patients status. Will continue to monitor. Dicie BeamFrazier, Deshawn Witty RN BSN.

## 2017-07-20 NOTE — Anesthesia Preprocedure Evaluation (Addendum)
Anesthesia Evaluation  Patient identified by MRN, date of birth, ID band Patient awake    Reviewed: Allergy & Precautions, H&P , Patient's Chart, lab work & pertinent test results, reviewed documented beta blocker date and time   Airway Mallampati: II  TM Distance: >3 FB Neck ROM: full    Dental no notable dental hx.    Pulmonary Current Smoker,    Pulmonary exam normal breath sounds clear to auscultation       Cardiovascular  Rhythm:regular Rate:Normal     Neuro/Psych    GI/Hepatic   Endo/Other    Renal/GU      Musculoskeletal   Abdominal   Peds  Hematology   Anesthesia Other Findings   Reproductive/Obstetrics                             Anesthesia Physical Anesthesia Plan  ASA: II  Anesthesia Plan: General   Post-op Pain Management:    Induction: Intravenous  PONV Risk Score and Plan:   Airway Management Planned: Oral ETT  Additional Equipment:   Intra-op Plan:   Post-operative Plan: Extubation in OR  Informed Consent: I have reviewed the patients History and Physical, chart, labs and discussed the procedure including the risks, benefits and alternatives for the proposed anesthesia with the patient or authorized representative who has indicated his/her understanding and acceptance.   Dental Advisory Given  Plan Discussed with: CRNA and Surgeon  Anesthesia Plan Comments: (  )        Anesthesia Quick Evaluation  

## 2017-07-21 ENCOUNTER — Encounter (HOSPITAL_COMMUNITY): Payer: Self-pay | Admitting: Orthopedic Surgery

## 2017-07-21 LAB — BASIC METABOLIC PANEL
ANION GAP: 9 (ref 5–15)
BUN: 15 mg/dL (ref 6–20)
CHLORIDE: 109 mmol/L (ref 101–111)
CO2: 23 mmol/L (ref 22–32)
Calcium: 7.5 mg/dL — ABNORMAL LOW (ref 8.9–10.3)
Creatinine, Ser: 1.27 mg/dL — ABNORMAL HIGH (ref 0.61–1.24)
GFR calc non Af Amer: >60 mL/min (ref >60)
GLUCOSE: 127 mg/dL — AB (ref 65–99)
POTASSIUM: 4.2 mmol/L (ref 3.5–5.1)
Sodium: 141 mmol/L (ref 135–145)

## 2017-07-21 LAB — CBC
HEMATOCRIT: 26.8 % — AB (ref 39.0–52.0)
HEMOGLOBIN: 9 g/dL — AB (ref 13.0–17.0)
MCH: 30.2 pg (ref 26.0–34.0)
MCHC: 33.6 g/dL (ref 30.0–36.0)
MCV: 89.9 fL (ref 78.0–100.0)
Platelets: 153 10*3/uL (ref 150–400)
RBC: 2.98 MIL/uL — ABNORMAL LOW (ref 4.22–5.81)
RDW: 14.2 % (ref 11.5–15.5)
WBC: 6.6 10*3/uL (ref 4.0–10.5)

## 2017-07-21 LAB — BLOOD PRODUCT ORDER (VERBAL) VERIFICATION

## 2017-07-21 MED ORDER — TRAMADOL HCL 50 MG PO TABS
50.0000 mg | ORAL_TABLET | Freq: Four times a day (QID) | ORAL | Status: DC
  Administered 2017-07-21 – 2017-07-22 (×4): 50 mg via ORAL
  Filled 2017-07-21 (×4): qty 1

## 2017-07-21 MED ORDER — GABAPENTIN 300 MG PO CAPS
300.0000 mg | ORAL_CAPSULE | Freq: Two times a day (BID) | ORAL | Status: DC
  Administered 2017-07-21 – 2017-07-22 (×3): 300 mg via ORAL
  Filled 2017-07-21 (×3): qty 1
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
## 2017-07-21 NOTE — Progress Notes (Signed)
Patient was complaining of pain and pressure in abdomen. Patient stated he felt like he had to urinate but couldn't. Urine output from foley catheter had slowed down quite a bit. Spoke with urologist Dr. Annabell HowellsWrenn. Per MD I flushed foley catheter with 30 mL at a time. Large clot and blood tinged urine noted. After flushing x 3, patient with 440 output, including 90 mL from flushing. Will continue to monitor. Patient states feels better at this time.

## 2017-07-21 NOTE — Progress Notes (Signed)
Emergency contacts updated with patient and significant other. Dicie BeamFrazier, Evalee Gerard RN BSN.
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
## 2017-07-21 NOTE — Plan of Care (Signed)
Patient with no significant changes overnight. Doing well after surgery. Urine output adequate at this time with less blood visible in urine. Will continue to monitor.

## 2017-07-21 NOTE — Progress Notes (Signed)
Rehab Admissions Coordinator Note:  Patient was screened by Clois DupesBoyette, Trystian Crisanto Godwin for appropriateness for an Inpatient Acute Rehab Consult per PT and OT recommendations.  At this time, we are recommending Inpatient Rehab consult. Please place order for consult if pt would like to be considered for possible admit.   Clois DupesBoyette, Daliana Leverett Godwin 07/21/2017, 6:03 PM  I can be reached at [phone].

## 2017-07-21 NOTE — Progress Notes (Signed)
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 and pelvis CT dated 07/19/2017. FINDINGS: Interval pelvic fixation hardware. Less widening of the right sacroiliac joint. Previously noted left acetabular fracture. There is a rounded washer overlying the right iliac bone superior to the sacroiliac fixation screw. There is a similar washer around the proximal portion of the screw. Left pelvic surgical clips. Minimal lumbar spine degenerative changes. Normal bowel gas pattern. IMPRESSION: 1. Rounded washer overlying the right iliac bone superior to the sacroiliac fixation screw. This is not attached to any hardware. 2. Postoperative changes, as described above. Critical Value/emergent results were called by telephone at the time of interpretation on 07/20/2017 at 8:02 pm to [facility] in the operating room, who verbally acknowledged these results. He reported that this is an expected finding based on the initial placement of the screw. Electronically Signed   By: Beckie Salts M.D.   On: 07/20/2017 20:05    Assessment: Extraperitoneal bladder rupture status post repair 07/20/2017 Procedure(s): OPEN REDUCTION INTERNAL FIXATION (ORIF) ACETABULAR FRACTURE OPEN REDUCTION INTERNAL FIXATION (ORIF) PELVIC FRACTURE REPAIR OF BLADDER RUPTURE, 1 Day  Post-Op  doing well.  Plan: Continue Foley catheter for 10-14 days with a fluoroscopic cystogram prior to removal   Modena Slater, MD Urology 07/21/2017, 8:31 AM
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
## 2017-07-22 ENCOUNTER — Encounter (HOSPITAL_COMMUNITY): Payer: Self-pay | Admitting: *Deleted

## 2017-07-22 ENCOUNTER — Other Ambulatory Visit: Payer: Self-pay

## 2017-07-22 ENCOUNTER — Inpatient Hospital Stay (HOSPITAL_COMMUNITY)
Admission: RE | Admit: 2017-07-22 | Discharge: 2017-08-04 | DRG: 560 | Disposition: A | Discharge status: Home / Self Care | Payer: No Typology Code available for payment source | Source: Intra-hospital | Attending: Physical Medicine & Rehabilitation | Admitting: Physical Medicine & Rehabilitation

## 2017-07-22 ENCOUNTER — Encounter (HOSPITAL_COMMUNITY): Payer: Self-pay

## 2017-07-22 ENCOUNTER — Inpatient Hospital Stay (HOSPITAL_COMMUNITY): Payer: No Typology Code available for payment source

## 2017-07-22 DIAGNOSIS — S32402D Unspecified fracture of left acetabulum, subsequent encounter for fracture with routine healing: Secondary | ICD-10-CM | POA: Diagnosis not present

## 2017-07-22 DIAGNOSIS — K5900 Constipation, unspecified: Secondary | ICD-10-CM | POA: Diagnosis present

## 2017-07-22 DIAGNOSIS — I10 Essential (primary) hypertension: Secondary | ICD-10-CM | POA: Diagnosis present

## 2017-07-22 DIAGNOSIS — N3289 Other specified disorders of bladder: Secondary | ICD-10-CM

## 2017-07-22 DIAGNOSIS — S329XXA Fracture of unspecified parts of lumbosacral spine and pelvis, initial encounter for closed fracture: Secondary | ICD-10-CM | POA: Diagnosis present

## 2017-07-22 DIAGNOSIS — R509 Fever, unspecified: Secondary | ICD-10-CM

## 2017-07-22 DIAGNOSIS — F1010 Alcohol abuse, uncomplicated: Secondary | ICD-10-CM

## 2017-07-22 DIAGNOSIS — D62 Acute posthemorrhagic anemia: Secondary | ICD-10-CM | POA: Diagnosis present

## 2017-07-22 DIAGNOSIS — S32810A Multiple fractures of pelvis with stable disruption of pelvic ring, initial encounter for closed fracture: Secondary | ICD-10-CM

## 2017-07-22 DIAGNOSIS — S01511D Laceration without foreign body of lip, subsequent encounter: Secondary | ICD-10-CM

## 2017-07-22 DIAGNOSIS — F17210 Nicotine dependence, cigarettes, uncomplicated: Secondary | ICD-10-CM | POA: Diagnosis present

## 2017-07-22 DIAGNOSIS — S3282XD Multiple fractures of pelvis without disruption of pelvic ring, subsequent encounter for fracture with routine healing: Secondary | ICD-10-CM | POA: Diagnosis present

## 2017-07-22 DIAGNOSIS — Z7141 Alcohol abuse counseling and surveillance of alcoholic: Secondary | ICD-10-CM | POA: Diagnosis not present

## 2017-07-22 DIAGNOSIS — S32409A Unspecified fracture of unspecified acetabulum, initial encounter for closed fracture: Secondary | ICD-10-CM

## 2017-07-22 DIAGNOSIS — K5903 Drug induced constipation: Secondary | ICD-10-CM

## 2017-07-22 DIAGNOSIS — T148XXA Other injury of unspecified body region, initial encounter: Secondary | ICD-10-CM

## 2017-07-22 DIAGNOSIS — M792 Neuralgia and neuritis, unspecified: Secondary | ICD-10-CM

## 2017-07-22 DIAGNOSIS — S3720XA Unspecified injury of bladder, initial encounter: Secondary | ICD-10-CM

## 2017-07-22 DIAGNOSIS — Z91013 Allergy to seafood: Secondary | ICD-10-CM

## 2017-07-22 DIAGNOSIS — T07XXXA Unspecified multiple injuries, initial encounter: Secondary | ICD-10-CM

## 2017-07-22 DIAGNOSIS — S31000A Unspecified open wound of lower back and pelvis without penetration into retroperitoneum, initial encounter: Secondary | ICD-10-CM

## 2017-07-22 DIAGNOSIS — M62838 Other muscle spasm: Secondary | ICD-10-CM

## 2017-07-22 DIAGNOSIS — S3330XA Dislocation of unspecified parts of lumbar spine and pelvis, initial encounter: Secondary | ICD-10-CM

## 2017-07-22 DIAGNOSIS — S01511A Laceration without foreign body of lip, initial encounter: Secondary | ICD-10-CM

## 2017-07-22 LAB — PROTIME-INR
INR: 1.06
Prothrombin Time: 13.7 s (ref 11.4–15.2)

## 2017-07-22 LAB — URINALYSIS, ROUTINE W REFLEX MICROSCOPIC
Bilirubin Urine: NEGATIVE
Glucose, UA: NEGATIVE mg/dL
Ketones, ur: NEGATIVE mg/dL
NITRITE: NEGATIVE
Protein, ur: 30 mg/dL — AB
SPECIFIC GRAVITY, URINE: 1.013 (ref 1.005–1.030)
SQUAMOUS EPITHELIAL / LPF: NONE SEEN
pH: 9 — ABNORMAL HIGH (ref 5.0–8.0)

## 2017-07-22 LAB — CBC
HEMATOCRIT: 27.9 % — AB (ref 39.0–52.0)
HEMOGLOBIN: 8.9 g/dL — AB (ref 13.0–17.0)
MCH: 29.4 pg (ref 26.0–34.0)
MCHC: 31.9 g/dL (ref 30.0–36.0)
MCV: 92.1 fL (ref 78.0–100.0)
Platelets: 159 10*3/uL (ref 150–400)
RBC: 3.03 MIL/uL — ABNORMAL LOW (ref 4.22–5.81)
RDW: 14 % (ref 11.5–15.5)
WBC: 6.6 10*3/uL (ref 4.0–10.5)

## 2017-07-22 MED ORDER — ADULT MULTIVITAMIN W/MINERALS CH
1.0000 | ORAL_TABLET | Freq: Every day | ORAL | Status: DC
  Administered 2017-07-23 – 2017-08-04 (×13): 1 via ORAL
  Filled 2017-07-22 (×13): qty 1

## 2017-07-22 MED ORDER — POLYETHYLENE GLYCOL 3350 17 G PO PACK
17.0000 g | PACK | Freq: Every day | ORAL | Status: DC
  Administered 2017-07-22: 17 g via ORAL
  Filled 2017-07-22: qty 1

## 2017-07-22 MED ORDER — HYDROMORPHONE HCL 1 MG/ML IJ SOLN: 1.0000 mg | INTRAMUSCULAR | Status: DC | PRN

## 2017-07-22 MED ORDER — ONDANSETRON HCL 4 MG/2ML IJ SOLN: 4.0000 mg | Freq: Four times a day (QID) | INTRAMUSCULAR | Status: DC | PRN

## 2017-07-22 MED ORDER — GABAPENTIN 300 MG PO CAPS
300.0000 mg | ORAL_CAPSULE | Freq: Two times a day (BID) | ORAL | Status: DC
  Administered 2017-07-22 – 2017-08-04 (×26): 300 mg via ORAL
  Filled 2017-07-22 (×26): qty 1

## 2017-07-22 MED ORDER — WARFARIN - PHARMACIST DOSING INPATIENT
Freq: Every day | Status: DC
  Administered 2017-07-23 – 2017-08-03 (×7)

## 2017-07-22 MED ORDER — WARFARIN SODIUM 5 MG PO TABS
5.0000 mg | ORAL_TABLET | Freq: Once | ORAL | Status: AC
  Administered 2017-07-22: 5 mg via ORAL
  Filled 2017-07-22: qty 1

## 2017-07-22 MED ORDER — TRAMADOL HCL 50 MG PO TABS
50.0000 mg | ORAL_TABLET | Freq: Four times a day (QID) | ORAL | Status: DC
  Administered 2017-07-22 – 2017-08-04 (×49): 50 mg via ORAL
  Filled 2017-07-22 (×51): qty 1

## 2017-07-22 MED ORDER — ENOXAPARIN SODIUM 40 MG/0.4ML ~~LOC~~ SOLN
40.0000 mg | SUBCUTANEOUS | Status: DC
  Administered 2017-07-23 – 2017-08-03 (×12): 40 mg via SUBCUTANEOUS
  Filled 2017-07-22 (×12): qty 0.4

## 2017-07-22 MED ORDER — DOCUSATE SODIUM 100 MG PO CAPS
100.0000 mg | ORAL_CAPSULE | Freq: Two times a day (BID) | ORAL | Status: DC
  Administered 2017-07-22 – 2017-07-29 (×13): 100 mg via ORAL
  Filled 2017-07-22 (×14): qty 1

## 2017-07-22 MED ORDER — CIPROFLOXACIN HCL 500 MG PO TABS
500.0000 mg | ORAL_TABLET | Freq: Two times a day (BID) | ORAL | Status: DC
  Administered 2017-07-22 – 2017-07-24 (×4): 500 mg via ORAL
  Filled 2017-07-22 (×4): qty 1

## 2017-07-22 MED ORDER — OXYBUTYNIN CHLORIDE 5 MG PO TABS
5.0000 mg | ORAL_TABLET | Freq: Three times a day (TID) | ORAL | Status: DC
  Administered 2017-07-22 – 2017-08-04 (×38): 5 mg via ORAL
  Filled 2017-07-22 (×38): qty 1

## 2017-07-22 MED ORDER — WARFARIN - PHARMACIST DOSING INPATIENT: Freq: Every day | Status: DC

## 2017-07-22 MED ORDER — POLYETHYLENE GLYCOL 3350 17 G PO PACK
17.0000 g | PACK | Freq: Every day | ORAL | Status: DC
  Administered 2017-07-24 – 2017-07-29 (×5): 17 g via ORAL
  Filled 2017-07-22 (×3): qty 1

## 2017-07-22 MED ORDER — MIRABEGRON ER 25 MG PO TB24
25.0000 mg | ORAL_TABLET | Freq: Every day | ORAL | Status: DC
  Administered 2017-07-22: 25 mg via ORAL
  Filled 2017-07-22: qty 1

## 2017-07-22 MED ORDER — WARFARIN SODIUM 5 MG PO TABS: 5.0000 mg | ORAL_TABLET | Freq: Once | ORAL | Status: DC

## 2017-07-22 MED ORDER — POLYETHYLENE GLYCOL 3350 17 G PO PACK
17.0000 g | PACK | Freq: Every day | ORAL | Status: DC
  Administered 2017-07-23 – 2017-07-28 (×2): 17 g via ORAL
  Filled 2017-07-22 (×6): qty 1

## 2017-07-22 MED ORDER — ACETAMINOPHEN 325 MG PO TABS
650.0000 mg | ORAL_TABLET | Freq: Four times a day (QID) | ORAL | Status: DC
  Administered 2017-07-22 – 2017-08-04 (×50): 650 mg via ORAL
  Filled 2017-07-22 (×51): qty 2

## 2017-07-22 MED ORDER — FOLIC ACID 1 MG PO TABS
1.0000 mg | ORAL_TABLET | Freq: Every day | ORAL | Status: DC
  Administered 2017-07-23 – 2017-08-04 (×13): 1 mg via ORAL
  Filled 2017-07-22 (×13): qty 1

## 2017-07-22 MED ORDER — VITAMIN B-1 100 MG PO TABS
100.0000 mg | ORAL_TABLET | Freq: Every day | ORAL | Status: DC
  Administered 2017-07-23 – 2017-08-04 (×13): 100 mg via ORAL
  Filled 2017-07-22 (×13): qty 1

## 2017-07-22 MED ORDER — SORBITOL 70 % SOLN
30.0000 mL | Freq: Every day | Status: DC | PRN
  Administered 2017-07-28: 30 mL via ORAL
  Filled 2017-07-22: qty 30

## 2017-07-22 MED ORDER — OXYCODONE HCL 5 MG PO TABS
10.0000 mg | ORAL_TABLET | Freq: Four times a day (QID) | ORAL | Status: DC | PRN
  Administered 2017-07-22: 15 mg via ORAL
  Administered 2017-07-23: 10 mg via ORAL
  Administered 2017-07-23: 15 mg via ORAL
  Administered 2017-07-24: 10 mg via ORAL
  Administered 2017-07-26 – 2017-08-03 (×25): 15 mg via ORAL
  Filled 2017-07-22 (×15): qty 3
  Filled 2017-07-22: qty 2
  Filled 2017-07-22 (×5): qty 3
  Filled 2017-07-22: qty 2
  Filled 2017-07-22 (×8): qty 3

## 2017-07-22 MED ORDER — METHOCARBAMOL 750 MG PO TABS
750.0000 mg | ORAL_TABLET | Freq: Four times a day (QID) | ORAL | Status: DC
  Administered 2017-07-22 – 2017-08-04 (×50): 750 mg via ORAL
  Filled 2017-07-22 (×51): qty 1

## 2017-07-22 MED ORDER — THIAMINE HCL 100 MG/ML IJ SOLN
100.0000 mg | Freq: Every day | INTRAMUSCULAR | Status: DC
  Filled 2017-07-22 (×12): qty 1

## 2017-07-22 MED ORDER — ONDANSETRON HCL 4 MG PO TABS: 4.0000 mg | ORAL_TABLET | Freq: Four times a day (QID) | ORAL | Status: DC | PRN

## 2017-07-22 MED ORDER — WARFARIN VIDEO: Freq: Once | Status: DC

## 2017-07-22 MED ORDER — MIRABEGRON ER 25 MG PO TB24
25.0000 mg | ORAL_TABLET | Freq: Every day | ORAL | Status: DC
  Administered 2017-07-23 – 2017-08-04 (×13): 25 mg via ORAL
  Filled 2017-07-22 (×13): qty 1

## 2017-07-22 MED ORDER — COUMADIN BOOK
Freq: Once | Status: AC
  Administered 2017-07-22: 14:00:00
  Filled 2017-07-22: qty 1

## 2017-07-22 NOTE — Progress Notes (Signed)
Patient with fever and elevated BP. Has had fevers since early am--likely due to UTI. Will pan culture as well as check CXR. Will order flutter valve for IS. Start patient on cipro once BC drawn. Labs this am WNL.

## 2017-07-22 NOTE — Progress Notes (Signed)
During nurse rounding, patient states that he was experiencing tenderness in his abdomen, pain radiating to his right leg, and inability to urinate. Patient stated that he experienced this last night and nurse was instructed to irrigate and flush catheter. After speaking with ICU nurse and charge nurse, patient was properly irrigated and flushed which ultimately resulted in relief.
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
## 2017-07-22 NOTE — Progress Notes (Signed)
Pt instructed on use of flutter valve and left at bedside.
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
## 2017-07-23 ENCOUNTER — Inpatient Hospital Stay (HOSPITAL_COMMUNITY): Payer: Self-pay | Admitting: Speech Pathology

## 2017-07-23 ENCOUNTER — Inpatient Hospital Stay (HOSPITAL_COMMUNITY): Payer: Self-pay | Admitting: Physical Therapy

## 2017-07-23 ENCOUNTER — Inpatient Hospital Stay (HOSPITAL_COMMUNITY): Payer: No Typology Code available for payment source

## 2017-07-23 ENCOUNTER — Inpatient Hospital Stay (HOSPITAL_COMMUNITY): Payer: Self-pay | Admitting: Occupational Therapy

## 2017-07-23 DIAGNOSIS — K5901 Slow transit constipation: Secondary | ICD-10-CM

## 2017-07-23 DIAGNOSIS — S32810S Multiple fractures of pelvis with stable disruption of pelvic ring, sequela: Secondary | ICD-10-CM

## 2017-07-23 DIAGNOSIS — D62 Acute posthemorrhagic anemia: Secondary | ICD-10-CM

## 2017-07-23 DIAGNOSIS — M7989 Other specified soft tissue disorders: Secondary | ICD-10-CM

## 2017-07-23 LAB — TYPE AND SCREEN
ABO/RH(D): O POS
ANTIBODY SCREEN: NEGATIVE
UNIT DIVISION: 0
Unit division: 0
Unit division: 0
Unit division: 0
Unit division: 0
Unit division: 0

## 2017-07-23 LAB — BPAM RBC
BLOOD PRODUCT EXPIRATION DATE: 201904272359
BLOOD PRODUCT EXPIRATION DATE: 201905092359
BLOOD PRODUCT EXPIRATION DATE: 201905092359
Blood Product Expiration Date: 201904272359
Blood Product Expiration Date: 201905092359
Blood Product Expiration Date: 201905092359
ISSUE DATE / TIME: 201904150836
ISSUE DATE / TIME: 201904150836
ISSUE DATE / TIME: 201904151400
ISSUE DATE / TIME: 201904151400
UNIT TYPE AND RH: 5100
UNIT TYPE AND RH: 9500
Unit Type and Rh: 5100
Unit Type and Rh: 5100
Unit Type and Rh: 5100
Unit Type and Rh: 9500

## 2017-07-23 LAB — CBC WITH DIFFERENTIAL/PLATELET
BASOS PCT: 0 %
Basophils Absolute: 0 10*3/uL (ref 0.0–0.1)
EOS ABS: 0.3 10*3/uL (ref 0.0–0.7)
Eosinophils Relative: 5 %
HCT: 25 % — ABNORMAL LOW (ref 39.0–52.0)
Hemoglobin: 7.9 g/dL — ABNORMAL LOW (ref 13.0–17.0)
Lymphocytes Relative: 16 %
Lymphs Abs: 1.1 10*3/uL (ref 0.7–4.0)
MCH: 29 pg (ref 26.0–34.0)
MCHC: 31.6 g/dL (ref 30.0–36.0)
MCV: 91.9 fL (ref 78.0–100.0)
Monocytes Absolute: 0.4 10*3/uL (ref 0.1–1.0)
Monocytes Relative: 5 %
NEUTROS ABS: 5.1 10*3/uL (ref 1.7–7.7)
NEUTROS PCT: 74 %
Platelets: 185 10*3/uL (ref 150–400)
RBC: 2.72 MIL/uL — AB (ref 4.22–5.81)
RDW: 13.4 % (ref 11.5–15.5)
WBC: 6.9 10*3/uL (ref 4.0–10.5)

## 2017-07-23 LAB — PROTIME-INR
INR: 1.09
Prothrombin Time: 14.1 seconds (ref 11.4–15.2)

## 2017-07-23 LAB — COMPREHENSIVE METABOLIC PANEL
ALBUMIN: 2.7 g/dL — AB (ref 3.5–5.0)
ALT: 75 U/L — AB (ref 17–63)
ANION GAP: 8 (ref 5–15)
AST: 144 U/L — AB (ref 15–41)
Alkaline Phosphatase: 46 U/L (ref 38–126)
BUN: 10 mg/dL (ref 6–20)
CALCIUM: 8.2 mg/dL — AB (ref 8.9–10.3)
CO2: 26 mmol/L (ref 22–32)
CREATININE: 1.09 mg/dL (ref 0.61–1.24)
Chloride: 104 mmol/L (ref 101–111)
GFR calc Af Amer: >60 mL/min (ref >60)
GFR calc non Af Amer: >60 mL/min (ref >60)
GLUCOSE: 129 mg/dL — AB (ref 65–99)
Potassium: 3.6 mmol/L (ref 3.5–5.1)
SODIUM: 138 mmol/L (ref 135–145)
Total Bilirubin: 0.7 mg/dL (ref 0.3–1.2)
Total Protein: 5.7 g/dL — ABNORMAL LOW (ref 6.5–8.1)

## 2017-07-23 MED ORDER — WARFARIN SODIUM 5 MG PO TABS
5.0000 mg | ORAL_TABLET | Freq: Once | ORAL | Status: AC
  Administered 2017-07-23: 5 mg via ORAL
  Filled 2017-07-23: qty 1

## 2017-07-23 MED ORDER — SORBITOL 70 % SOLN
60.0000 mL | Status: AC
  Administered 2017-07-23: 60 mL via ORAL
  Filled 2017-07-23 (×2): qty 60

## 2017-07-23 MED ORDER — BISACODYL 10 MG RE SUPP
10.0000 mg | Freq: Every day | RECTAL | Status: DC | PRN
  Filled 2017-07-23: qty 1
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
## 2017-07-23 NOTE — Plan of Care (Signed)
  Problem: Consults Goal: RH GENERAL PATIENT EDUCATION Description See Patient Education module for education specifics. Outcome: Progressing Goal: Skin Care Protocol Initiated - if Braden Score 18 or less Description If consults are not indicated, leave blank or document N/A Outcome: Progressing Goal: Nutrition Consult-if indicated Outcome: Progressing   Problem: RH BOWEL ELIMINATION Goal: RH STG MANAGE BOWEL WITH ASSISTANCE Description STG Manage Bowel with  Min Assistance.  Outcome: Progressing Medication given as ordered.  Goal: RH STG MANAGE BOWEL W/MEDICATION W/ASSISTANCE Description STG Manage Bowel with Medication with  Mod I   Outcome: Progressing   Problem: RH BLADDER ELIMINATION Goal: RH STG MANAGE BLADDER WITH EQUIPMENT WITH ASSISTANCE Description STG Manage Bladder With Equipment With mod Assistance  Outcome: Progressing   Problem: RH SKIN INTEGRITY Goal: RH STG SKIN FREE OF INFECTION/BREAKDOWN Description Incisions will be free of infection at discharge  Outcome: Progressing Goal: RH STG MAINTAIN SKIN INTEGRITY WITH ASSISTANCE Description STG Maintain Skin Integrity With  Min Assistance.  Outcome: Progressing Goal: RH STG ABLE TO PERFORM INCISION/WOUND CARE W/ASSISTANCE Description STG Able To Perform Incision/Wound Care With  Mod Assistance.  Outcome: Progressing   Problem: RH SAFETY Goal: RH STG ADHERE TO SAFETY PRECAUTIONS W/ASSISTANCE/DEVICE Description STG Adhere to Safety Precautions With  Min Assistance/Device.  Outcome: Progressing   Problem: RH PAIN MANAGEMENT Goal: RH STG PAIN MANAGED AT OR BELOW PT'S PAIN GOAL Description Less than 4  Outcome: Progressing   Problem: RH KNOWLEDGE DEFICIT GENERAL Goal: RH STG INCREASE KNOWLEDGE OF SELF CARE AFTER HOSPITALIZATION Description Patient will be able to verbalize how to maintain safety  At home  Outcome: Progressing
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
## 2017-07-23 NOTE — Progress Notes (Signed)
Blood clots noted in urine when emptying foley. Jacalyn LefevreEunice Thomas on call contacted. Requesting catheter be irrigated and to continue to monitor. Ronnette JuniperKatlynn E Taylore Hinde, LPN

## 2017-07-23 NOTE — Discharge Instructions (Addendum)
Inpatient Rehab Discharge Instructions  Darrell Wright Discharge date and time: No discharge date for patient encounter.   Activities/Precautions/ Functional Status: Activity: Nonweightbearing bilateral lower extremities times 8 weeks total Diet: regular diet Wound Care: keep wound clean and dry Functional status:  ___ No restrictions     ___ Walk up steps independently ___ 24/7 supervision/assistance   ___ Walk up steps with assistance ___ Intermittent supervision/assistance  ___ Bathe/dress independently ___ Walk with walker     _x__ Bathe/dress with assistance ___ Walk Independently    ___ Shower independently ___ Walk with assistance    ___ Shower with assistance ___ No alcohol     ___ Return to work/school ________   COMMUNITY REFERRALS UPON DISCHARGE:    Home Health:   PT      RN                      Agency:  [facility] Phone: [phone]   Medical Equipment/Items Ordered:wheelchair, cushion, commode, tub bench and transfer board                                                    Agency/Supplier:  [facility] @ [phone]        Special Instructions: Home health nurse to check INR 08/07/2017 results to Montez MoritaKeith Paul physician assistant pager [phone] office [phone]  No driving smoking or alcohol   My questions have been answered and I understand these instructions. I will adhere to these goals and the provided educational materials after my discharge from the hospital.  Patient/Caregiver Signature _______________________________ Date __________  Clinician Signature _______________________________________ Date __________  Please bring this form and your medication list with you to all your follow-up doctor's appointments. Information on my medicine - Coumadin   (Warfarin)  This medication education was reviewed with me or my healthcare representative as part of my discharge preparation.    Why was Coumadin prescribed for you? Coumadin was prescribed  for you because you have a blood clot or a medical condition that can cause an increased risk of forming blood clots. Blood clots can cause serious health problems by blocking the flow of blood to the heart, lung, or brain. Coumadin can prevent harmful blood clots from forming. As a reminder your indication for Coumadin is:   Blood Clot Prevention After Orthopedic Surgery  What test will check on my response to Coumadin? While on Coumadin (warfarin) you will need to have an INR test regularly to ensure that your dose is keeping you in the desired range. The INR (international normalized ratio) number is calculated from the result of the laboratory test called prothrombin time (PT).  If an INR APPOINTMENT HAS NOT ALREADY BEEN MADE FOR YOU please schedule an appointment to have this lab work done by your health care provider within 7 days. Your INR goal is usually a number between:  2 to 3 or your provider may give you a more narrow range like 2-2.5.  Ask your health care provider during an office visit what your goal INR is.  What  do you need to  know  About  COUMADIN? Take Coumadin (warfarin) exactly as prescribed by your healthcare provider about the same time each day.  DO NOT stop taking without talking to the doctor who prescribed the medication.  Stopping  without other blood clot prevention medication to take the place of Coumadin may increase your risk of developing a new clot or stroke.  Get refills before you run out.  What do you do if you miss a dose? If you miss a dose, take it as soon as you remember on the same day then continue your regularly scheduled regimen the next day.  Do not take two doses of Coumadin at the same time.  Important Safety Information A possible side effect of Coumadin (Warfarin) is an increased risk of bleeding. You should call your healthcare provider right away if you experience any of the following: ? Bleeding from an injury or your nose that does not  stop. ? Unusual colored urine (red or dark brown) or unusual colored stools (red or black). ? Unusual bruising for unknown reasons. ? A serious fall or if you hit your head (even if there is no bleeding).  Some foods or medicines interact with Coumadin (warfarin) and might alter your response to warfarin. To help avoid this: ? Eat a balanced diet, maintaining a consistent amount of Vitamin K. ? Notify your provider about major diet changes you plan to make. ? Avoid alcohol or limit your intake to 1 drink for women and 2 drinks for men per day. (1 drink is 5 oz. wine, 12 oz. beer, or 1.5 oz. liquor.)  Make sure that ANY health care provider who prescribes medication for you knows that you are taking Coumadin (warfarin).  Also make sure the healthcare provider who is monitoring your Coumadin knows when you have started a new medication including herbals and non-prescription products.  Coumadin (Warfarin)  Major Drug Interactions  Increased Warfarin Effect Decreased Warfarin Effect  Alcohol (large quantities) Antibiotics (esp. Septra/Bactrim, Flagyl, Cipro) Amiodarone (Cordarone) Aspirin (ASA) Cimetidine (Tagamet) Megestrol (Megace) NSAIDs (ibuprofen, naproxen, etc.) Piroxicam (Feldene) Propafenone (Rythmol SR) Propranolol (Inderal) Isoniazid (INH) Posaconazole (Noxafil) Barbiturates (Phenobarbital) Carbamazepine (Tegretol) Chlordiazepoxide (Librium) Cholestyramine (Questran) Griseofulvin Oral Contraceptives Rifampin Sucralfate (Carafate) Vitamin K   Coumadin (Warfarin) Major Herbal Interactions  Increased Warfarin Effect Decreased Warfarin Effect  Garlic Ginseng Ginkgo biloba Coenzyme Q10 Green tea St. Johns wort    Coumadin (Warfarin) FOOD Interactions  Eat a consistent number of servings per week of foods HIGH in Vitamin K (1 serving =  cup)  Collards (cooked, or boiled & drained) Kale (cooked, or boiled & drained) Mustard greens (cooked, or boiled &  drained) Parsley *serving size only =  cup Spinach (cooked, or boiled & drained) Swiss chard (cooked, or boiled & drained) Turnip greens (cooked, or boiled & drained)  Eat a consistent number of servings per week of foods MEDIUM-HIGH in Vitamin K (1 serving = 1 cup)  Asparagus (cooked, or boiled & drained) Broccoli (cooked, boiled & drained, or raw & chopped) Brussel sprouts (cooked, or boiled & drained) *serving size only =  cup Lettuce, raw (green leaf, endive, romaine) Spinach, raw Turnip greens, raw & chopped   These websites have more information on Coumadin (warfarin):  http://www.king-russell.com/; https://www.hines.net/;

## 2017-07-23 NOTE — Progress Notes (Signed)
ANTICOAGULATION CONSULT NOTE - Initial Consult  Pharmacy Consult for warfarin Indication: VTE prophylaxis  Allergies  Allergen Reactions  . Fish Allergy Nausea And Vomiting and Swelling  . Other Nausea And Vomiting and Swelling    All Seafood  . Shellfish Allergy Nausea And Vomiting and Swelling    Patient Measurements: Height: 5\' 5"  (165.1 cm) Weight: 217 lb 8 oz (98.7 kg) IBW/kg (Calculated) : 61.5    Vital Signs: Temp: 99.4 F (37.4 C) (04/18 0412) Temp Source: Oral (04/18 0412) BP: 166/117 (04/18 0412) Pulse Rate: 84 (04/18 0412)  Labs: Recent Labs    07/20/17 0901 07/21/17 0500 07/22/17 0400 07/22/17 0938 07/23/17 0615  HGB 10.7* 9.0* 8.9*  --  7.9*  HCT 32.7* 26.8* 27.9*  --  25.0*  PLT 264 153 159  --  185  APTT 31  --   --   --   --   LABPROT 13.4  --   --  13.7 14.1  INR 1.03  --   --  1.06 1.09  CREATININE  --  1.27*  --   --  1.09     Assessment: [facility] here with multiple fractures amongst other medical issues as well. Ortho recommends 8 weeks of warfarin for VTE prophylaxis. Will use prophylactic Lovenox as 'bridge' to therapeutic INR.  INR 1.09 after one does of coumadin 5 mg yesterday, noted started cipro last night. PO intake is good. hgb 7.9 down from 8.9.   Goal of Therapy:  INR 2-3 Monitor platelets by anticoagulation protocol: Yes   Plan:  -Warfarin 5 mg po x1 -Daily INR -Discontinue Lovenox when INR >/ 2 -Monitor CBC closely    Bayard HuggerMei Yaakov Saindon, PharmD, BCPS  Clinical Pharmacist  Pager: [phone]   07/23/2017,8:52 AM
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
## 2017-07-23 NOTE — Progress Notes (Signed)
Bilateral lower extremity venous duplex has been completed. Negative for DVT.  07/23/17 11:21 AM Olen CordialGreg Willson Lipa RVT
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
## 2017-07-23 NOTE — Progress Notes (Signed)
Bladder irrigated with 120ml sterile saline. Return clear without any clots noted. Pt denies pain or pressure at this time. Will continue to monitor. Ronnette JuniperKatlynn E Verlie Hellenbrand, LPN
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
## 2017-07-23 NOTE — Progress Notes (Signed)
Physical Therapy Session Note  Patient Details  Name: Darrell Wright MRN: [id number] Date of Birth: 05/15/1975  Today's Date: 07/23/2017 PT Individual Time: 1500-1540 PT Individual Time Calculation (min): 40 min   Short Term Goals: Week 1:  PT Short Term Goal 1 (Week 1): Pt will complete supine to sit with min assist x 1 consistently PT Short Term Goal 2 (Week 1): Pt will perform least restrictive transfer bed to/from w/c with min assist PT Short Term Goal 3 (Week 1): Pt will propel manual w/c x 50 ft with min assist  Skilled Therapeutic Interventions/Progress Updates:    Pt semi-reclined in bed, agreeable to PT. Pt reports 6/10 pain in R hip this PM. A/P transfer bed to reclining w/c with max assist for LE management and trunk control. Manual w/c propulsion 2 x 50 ft with one turn with min assist for steering. Educated pt on self ROM he can perform with BLE while seated in w/c or in bed. Pt left seated in w/c with needs in reach, ice pack to R hip.  Therapy Documentation Precautions:  Precautions Precautions: Fall Restrictions Weight Bearing Restrictions: Yes RLE Weight Bearing: Non weight bearing LLE Weight Bearing: Non weight bearing  See Function Navigator for Current Functional Status.   Therapy/Group: Individual Therapy  Peter Congoaylor Lakesia Dahle, PT, DPT  07/23/2017, 3:52 PM

## 2017-07-24 ENCOUNTER — Inpatient Hospital Stay (HOSPITAL_COMMUNITY): Payer: Self-pay

## 2017-07-24 ENCOUNTER — Inpatient Hospital Stay (HOSPITAL_COMMUNITY): Payer: Self-pay | Admitting: Physical Therapy

## 2017-07-24 ENCOUNTER — Inpatient Hospital Stay (HOSPITAL_COMMUNITY): Payer: Self-pay | Admitting: Occupational Therapy

## 2017-07-24 DIAGNOSIS — M5418 Radiculopathy, sacral and sacrococcygeal region: Secondary | ICD-10-CM

## 2017-07-24 DIAGNOSIS — N3289 Other specified disorders of bladder: Secondary | ICD-10-CM

## 2017-07-24 LAB — URINE CULTURE: CULTURE: NO GROWTH

## 2017-07-24 LAB — PROTIME-INR
INR: 1.17
Prothrombin Time: 14.8 seconds (ref 11.4–15.2)

## 2017-07-24 MED ORDER — LISINOPRIL 10 MG PO TABS
10.0000 mg | ORAL_TABLET | Freq: Every day | ORAL | Status: DC
  Administered 2017-07-24 – 2017-07-26 (×3): 10 mg via ORAL
  Filled 2017-07-24 (×3): qty 1

## 2017-07-24 MED ORDER — CIPROFLOXACIN HCL 250 MG PO TABS
250.0000 mg | ORAL_TABLET | Freq: Two times a day (BID) | ORAL | Status: DC
  Administered 2017-07-24 – 2017-07-27 (×6): 250 mg via ORAL
  Filled 2017-07-24 (×6): qty 1

## 2017-07-24 MED ORDER — MAGNESIUM CITRATE PO SOLN
1.0000 | Freq: Once | ORAL | Status: AC
  Administered 2017-07-24: 1 via ORAL
  Filled 2017-07-24: qty 296

## 2017-07-24 MED ORDER — WARFARIN SODIUM 5 MG PO TABS
5.0000 mg | ORAL_TABLET | Freq: Once | ORAL | Status: AC
  Administered 2017-07-24: 5 mg via ORAL
  Filled 2017-07-24: qty 1

## 2017-07-24 NOTE — Plan of Care (Signed)
  Problem: Consults Goal: RH GENERAL PATIENT EDUCATION Description See Patient Education module for education specifics. Outcome: Progressing Goal: Skin Care Protocol Initiated - if Braden Score 18 or less Description If consults are not indicated, leave blank or document N/A Outcome: Progressing Goal: Nutrition Consult-if indicated Outcome: Progressing   Problem: RH BOWEL ELIMINATION Goal: RH STG MANAGE BOWEL WITH ASSISTANCE Description STG Manage Bowel with  Min Assistance.  Outcome: Progressing Goal: RH STG MANAGE BOWEL W/MEDICATION W/ASSISTANCE Description STG Manage Bowel with Medication with  Mod I   Outcome: Progressing   Problem: RH BLADDER ELIMINATION Goal: RH STG MANAGE BLADDER WITH EQUIPMENT WITH ASSISTANCE Description STG Manage Bladder With Equipment With mod Assistance  Outcome: Progressing   Problem: RH SKIN INTEGRITY Goal: RH STG SKIN FREE OF INFECTION/BREAKDOWN Description Incisions will be free of infection at discharge  Outcome: Progressing Goal: RH STG MAINTAIN SKIN INTEGRITY WITH ASSISTANCE Description STG Maintain Skin Integrity With  Min Assistance.  Outcome: Progressing Goal: RH STG ABLE TO PERFORM INCISION/WOUND CARE W/ASSISTANCE Description STG Able To Perform Incision/Wound Care With  Mod Assistance.  Outcome: Progressing   Problem: RH SAFETY Goal: RH STG ADHERE TO SAFETY PRECAUTIONS W/ASSISTANCE/DEVICE Description STG Adhere to Safety Precautions With  Min Assistance/Device.  Outcome: Progressing   Problem: RH PAIN MANAGEMENT Goal: RH STG PAIN MANAGED AT OR BELOW PT'S PAIN GOAL Description Less than 4  Outcome: Progressing   Problem: RH KNOWLEDGE DEFICIT GENERAL Goal: RH STG INCREASE KNOWLEDGE OF SELF CARE AFTER HOSPITALIZATION Description Patient will be able to verbalize how to maintain safety  At home  Outcome: Progressing   

## 2017-07-24 NOTE — Progress Notes (Signed)
Occupational Therapy Session Note  Patient Details  Name: Darrell Wright MRN: [id number] Date of Birth: [birth year]  Today's Date: 07/24/2017 OT Individual Time: 1045-1200 OT Individual Time Calculation (min): 75 min    Short Term Goals: Week 1:  OT Short Term Goal 1 (Week 1): Pt will transfer to wide drop arm BSC with mod A  OT Short Term Goal 2 (Week 1): Pt will perform toileting with mod A OT Short Term Goal 3 (Week 1): Pt will navigate his w/c around room to obtain things with min A   Skilled Therapeutic Interventions/Progress Updates:    Pt received in w/c stating he was in considerable pain and he felt that he had sat up for too long (2 hrs).  Pt was anxious to get back into bed.  Pt was so fatigued and in so much pain that he required 2 person assist with anterior transfer onto the bed with total A.  Once in the bed, pt was much more relaxed. After a short rest, recommended pt try sitting up in bed at a 60 degree angle for at least 5 min at a time (as he finds 45 degrees the most comfortable).  Pt sat up for 5 min then bed lowered to 45 for pt to engage in UE exercises. Back exercises of scapular retraction of 10-15 reps each arm for 3 sets with blue band.   8 pound weight for tricep extensions and elbow flexion 10x 3 sets. Bed adjusted back to 60 degrees for alt sh presses with 8 lb wt.   Pt tolerated sitting up for 10 min, then bed lowered again. Pt set up with tray to prepare for lunch.  Pt in room with all needs met.  Therapy Documentation Precautions:  Precautions Precautions: Fall Restrictions Weight Bearing Restrictions: Yes RLE Weight Bearing: Non weight bearing LLE Weight Bearing: Non weight bearing  Pain: Pain Assessment Pain Scale: 0-10 Pain Score: 7  Pain Type: Acute pain Pain Location: Genitalia Pain Orientation: Right Pain Descriptors / Indicators: Throbbing Pain Onset: Sudden Patients Stated Pain Goal: 0 Pain Intervention(s): Medication (See  eMAR);Repositioned;Cold applied ADL: ADL ADL Comments: see functional navigator  See Function Navigator for Current Functional Status.   Therapy/Group: Individual Therapy  [place] 07/24/2017, 11:25 AM

## 2017-07-24 NOTE — Progress Notes (Signed)
  Subjective: Patient reports mild supra pubic pain. urine is clear. No irrigation of the foley required over the past 24 hours.  Objective: Vital signs in last 24 hours: Temp:  [99.2 F (37.3 C)-99.6 F (37.6 C)] 99.6 F (37.6 C) (04/19 1400) Pulse Rate:  [70-76] 76 (04/19 1400) Resp:  [18-20] 20 (04/19 1400) BP: (153-160)/(95-99) 160/99 (04/19 1400) SpO2:  [97 %-98 %] 97 % (04/19 1400)  Intake/Output from previous day: 04/18 0701 - 04/19 0700 In: 720 [P.O.:720] Out: 1800 [Urine:1800] Intake/Output this shift: No intake/output data recorded.  Physical Exam:  General:alert, cooperative and appears stated age GI: soft, non tender, normal bowel sounds, no palpable masses, no organomegaly, no inguinal hernia Male genitalia: not done Extremities: extremities normal, atraumatic, no cyanosis or edema  Lab Results: Recent Labs    07/22/17 0400 07/23/17 0615  HGB 8.9* 7.9*  HCT 27.9* 25.0*   BMET Recent Labs    07/23/17 0615  NA 138  K 3.6  CL 104  CO2 26  GLUCOSE 129*  BUN 10  CREATININE 1.09  CALCIUM 8.2*   Recent Labs    07/22/17 0938 07/23/17 0615 07/24/17 0621  INR 1.06 1.09 1.17   No results for input(s): LABURIN in the last 72 hours. Results for orders placed or performed during the hospital encounter of 07/22/17  Culture, blood (routine x 2)     Status: None (Preliminary result)   Collection Time: 07/22/17  6:40 PM  Result Value Ref Range Status   Specimen Description BLOOD RIGHT ARM  Final   Special Requests   Final    BOTTLES DRAWN AEROBIC AND ANAEROBIC Blood Culture adequate volume   Culture   Final    NO GROWTH 2 DAYS Performed at [facility], [address] [place], [place] [address]    Report Status PENDING  Incomplete  Culture, blood (routine x 2)     Status: None (Preliminary result)   Collection Time: 07/22/17  6:43 PM  Result Value Ref Range Status   Specimen Description BLOOD RIGHT ARM  Final   Special Requests   Final   BOTTLES DRAWN AEROBIC AND ANAEROBIC Blood Culture results may not be optimal due to an excessive volume of blood received in culture bottles   Culture   Final    NO GROWTH 2 DAYS Performed at [facility], [address] [place], [place] [address]    Report Status PENDING  Incomplete  Culture, Urine     Status: None   Collection Time: 07/22/17  7:30 PM  Result Value Ref Range Status   Specimen Description URINE, CATHETERIZED  Final   Special Requests NONE  Final   Culture   Final    NO GROWTH Performed at [facility], [address] [place], [place] [address]    Report Status 07/24/2017 FINAL  Final    Studies/Results: No results found.  Assessment/Plan: 42yo with extraperitoneal bladder rupture  1. Please continue foley catheter to straigth drain. The foley will remain in place for 14 total days and the patient will have a cystogram prior to foley removal   LOS: 2 days   Wilkie Ayeatrick Rielle Schlauch 07/24/2017, 7:43 PM
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
## 2017-07-24 NOTE — Progress Notes (Signed)
Pt c/o pain to penis. Swelling noted. Dan PA notified. States place ice and he will come assess. Foley in place and draining.  Ronnette JuniperKatlynn E Janat Tabbert, LPN

## 2017-07-24 NOTE — Progress Notes (Signed)
Occupational Therapy Session Note  Patient Details  Name: Darrell Wright MRN: [id number] Date of Birth: 06/06/1975  Today's Date: 07/24/2017 OT Individual Time: 0800-0900 OT Individual Time Calculation (min): 60 min    Short Term Goals: Week 1:  OT Short Term Goal 1 (Week 1): Pt will transfer to wide drop arm BSC with mod A  OT Short Term Goal 2 (Week 1): Pt will perform toileting with mod A OT Short Term Goal 3 (Week 1): Pt will navigate his w/c around room to obtain things with min A   Skilled Therapeutic Interventions/Progress Updates:    Pt resting in bed upon arrival.  OT intervention with focus on bathing/dressing at bed level.  Pt requires assistance with LB bathing/dressing tasks.  Discussed toileting options and drop arm BSC provided.  Home measurement sheet provided to pt's SO.  Pt and SO stated that home was not w/c accessible.  Reassured pt and SO that we would problem solve with them once home measurement sheet returned.  Pt performed A/P transfer to w/c with mod A for BLE mgmt and trunk support. Pt remained in w/c with all needs within reach.   Therapy Documentation Precautions:  Precautions Precautions: Fall Restrictions Weight Bearing Restrictions: Yes RLE Weight Bearing: Non weight bearing LLE Weight Bearing: Non weight bearing  Pain: Pain Assessment Pain Scale: 0-10 Pain Score: 7  Pain Type: Acute pain Pain Location: Genitalia Pain Orientation: Right Pain Descriptors / Indicators: Throbbing Pain Onset: Sudden Patients Stated Pain Goal: 0 Pain Intervention(s): Medication (See eMAR);Repositioned;Cold applied  See Function Navigator for Current Functional Status.   Therapy/Group: Individual Therapy  Rich BraveLanier, Amulya Quintin Chappell 07/24/2017, 11:04 AM

## 2017-07-24 NOTE — Progress Notes (Signed)
Physical Therapy Session Note  Patient Details  Name: Darrell Wright MRN: [id number] Date of Birth: 10-Mar-1976  Today's Date: 07/24/2017 PT Individual Time: 1300-1400 PT Individual Time Calculation (min): 60 min   Short Term Goals: Week 1:  PT Short Term Goal 1 (Week 1): Pt will complete supine to sit with min assist x 1 consistently PT Short Term Goal 2 (Week 1): Pt will perform least restrictive transfer bed to/from w/c with min assist PT Short Term Goal 3 (Week 1): Pt will propel manual w/c x 50 ft with min assist  Skilled Therapeutic Interventions/Progress Updates:    c/o 8/10 pain at rest, declines out of bed mobility but agreeable to bed level therex and working on upright tolerance.  PT provided extensive education on d/c planning and w/c level goals throughout session.  Pt completes 10 reps bilateral LE therex as follows: ankle pumps with extended stretch at end range, quad sets, glute sets, heel slides, and hip abd/add.  Pt able to tolerate HOB raised to 60 degrees x5 minutes +2 minutes while completing 10 reps bicep curls (bilat), chest press, and shoulder press.  Positioned to comfort with call bell in reach and needs met.   Therapy Documentation Precautions:  Precautions Precautions: Fall Restrictions Weight Bearing Restrictions: Yes RLE Weight Bearing: Non weight bearing LLE Weight Bearing: Non weight bearing  See Function Navigator for Current Functional Status.   Therapy/Group: Individual Therapy  [name] 07/24/2017, 3:09 PM

## 2017-07-24 NOTE — IPOC Note (Signed)
Overall Plan of Care [facility](IPOC) Patient Details Name: Darrell KollerKelly M Hackmann MRN: [id number] DOB: 06/03/1975  Admitting Diagnosis: <principal problem not specified>pelvic fractures  Hospital Problems: Active Problems:   Pelvic fracture [facility](HCC)     Functional Problem List: Nursing Bladder, Bowel, Pain, Edema, Endurance  PT Balance, Endurance, Motor, Pain, Safety  OT Balance, Edema, Endurance, Motor, Pain, Skin Integrity  SLP    TR         Basic ADL's: OT Grooming, Bathing, Dressing, Toileting     Advanced  ADL's: OT       Transfers: PT Bed Mobility, Bed to Chair, Car, State Street CorporationFurniture, Floor  OT Toilet     Locomotion: PT Ambulation, Psychologist, prison and probation servicesWheelchair Mobility, Stairs     Additional Impairments: OT None  SLP        TR      Anticipated Outcomes Item Anticipated Outcome  Self Feeding n/a  Swallowing      Basic self-care  min A   Toileting  supervision    Bathroom Transfers supervision  Bowel/Bladder  foley- to remain free of infection, maintain regular continent bms  Transfers  Supervision  Locomotion  Supervision at w/c level  Communication     Cognition     Pain  less than 3  Safety/Judgment  to remain fall free while in IP rehab   Therapy Plan: PT Intensity: Minimum of 1-2 x/day ,45 to 90 minutes PT Frequency: 5 out of 7 days PT Duration Estimated Length of Stay: 10-12 days OT Intensity: Minimum of 1-2 x/day, 45 to 90 minutes OT Frequency: 5 out of 7 days OT Duration/Estimated Length of Stay: ~!0-12 days      Team Interventions: Nursing Interventions Patient/Family Education, Disease Management/Prevention, Skin Care/Wound Management, Bladder Management, Pain Management, Bowel Management  PT interventions Discharge planning, DME/adaptive equipment instruction, Functional mobility training, Pain management, Patient/family education, Therapeutic Activities, Therapeutic Exercise, UE/LE Strength taining/ROM, Wheelchair propulsion/positioning  OT Interventions  Balance/vestibular training, Discharge planning, Pain management, Self Care/advanced ADL retraining, Therapeutic Activities, UE/LE Coordination activities, Disease mangement/prevention, Functional mobility training, Patient/family education, Skin care/wound managment, Therapeutic Exercise, Community reintegration, DME/adaptive equipment instruction, Psychosocial support, Splinting/orthotics, UE/LE Strength taining/ROM, Wheelchair propulsion/positioning  SLP Interventions    TR Interventions    SW/CM Interventions Discharge Planning, Psychosocial Support, Patient/Family Education   Barriers to Discharge MD  Medical stability  Nursing      PT Medical stability, Home environment access/layout, Weight bearing restrictions    OT Wound Care    SLP      SW       Team Discharge Planning: Destination: PT-Home ,OT- Home , SLP-  Projected Follow-up: PT-Home health PT, OT-  Home health OT, SLP-  Projected Equipment Needs: PT-Wheelchair (measurements), Wheelchair cushion (measurements), To be determined, OT-  , SLP-  Equipment Details: PT- , OT-wide drop arm commode Patient/family involved in discharge planning: PT- Patient,  OT-Patient, SLP-Patient  MD ELOS: 10-12 days Medical Rehab Prognosis:  Excellent Assessment: The patient has been admitted for CIR therapies with the diagnosis of pelvic fractures/poly trauma. The team will be addressing functional mobility, strength, stamina, balance, safety, adaptive techniques and equipment, self-care, bowel and bladder mgt, patient and caregiver education, pain mgt, wb precautions, w/c use. Goals have been set at supervision for mobility, self-care and ADL's at w/c level.Darrell Wright.    Darrell T. Swartz, MD, FAAPMR      See Team Conference Notes for weekly updates to the plan of care
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
## 2017-07-25 ENCOUNTER — Inpatient Hospital Stay (HOSPITAL_COMMUNITY): Payer: Self-pay

## 2017-07-25 ENCOUNTER — Inpatient Hospital Stay (HOSPITAL_COMMUNITY): Payer: No Typology Code available for payment source | Admitting: Occupational Therapy

## 2017-07-25 DIAGNOSIS — S32810D Multiple fractures of pelvis with stable disruption of pelvic ring, subsequent encounter for fracture with routine healing: Secondary | ICD-10-CM

## 2017-07-25 DIAGNOSIS — K5903 Drug induced constipation: Secondary | ICD-10-CM

## 2017-07-25 LAB — PROTIME-INR
INR: 1.2
Prothrombin Time: 15.1 seconds (ref 11.4–15.2)

## 2017-07-25 MED ORDER — WARFARIN SODIUM 5 MG PO TABS
5.0000 mg | ORAL_TABLET | Freq: Once | ORAL | Status: AC
  Administered 2017-07-25: 5 mg via ORAL
  Filled 2017-07-25: qty 1
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
## 2017-07-25 NOTE — Progress Notes (Signed)
Occupational Therapy Session Note  Patient Details  Name: Darrell Wright MRN: [id number] Date of Birth: [birth year]  Today's Date: 07/25/2017 OT Individual Time: 1100-1200 OT Individual Time Calculation (min): 60 min    Short Term Goals: Week 1:  OT Short Term Goal 1 (Week 1): Pt will transfer to wide drop arm BSC with mod A  OT Short Term Goal 2 (Week 1): Pt will perform toileting with mod A OT Short Term Goal 3 (Week 1): Pt will navigate his w/c around room to obtain things with min A   Skilled Therapeutic Interventions/Progress Updates:    Pt received supine in bed agreeable to therapy and pain as described below. Session focused on ADL transfers and B UE strength/endurance. 250cc emptied from foley. Vc and manual facilitation provided during bed level donning of shorts, with pt requiring max A overall. Instruction provided re rolling R and L to don pants posterior vs bridging in bed d/t NWB B LE precautions. Pt transitioned to sitting up in bed with mod A overall, with tactile cues provided for UE placement. Pt then transferred posteriorly into w/c with CGA and vc. Pt performed 3x 2:72mn on B UE ergometer to increase B UE endurance/strength needed to propel self in w/c and to lift up during NWB LE transfers. Pt then attempted to transfer anteriorly back into bed but d/t progressive fatigue and pain was unable to initiate. +2 A from NT acquired and pt required total A to transfer back into bed. Pt left supine in bed with bed alarm set and all needs met.    Therapy Documentation Precautions:  Precautions Precautions: Fall Restrictions Weight Bearing Restrictions: Yes RLE Weight Bearing: Non weight bearing LLE Weight Bearing: Non weight bearing Pain: Pain Assessment Pain Scale: 0-10 Pain Score: 8  Pain Type: Acute pain Pain Location: Hip Pain Orientation: Right Pain Descriptors / Indicators: Aching;Throbbing Pain Onset: With Activity Pain Intervention(s): Emotional  support;Repositioned;Relaxation ADL: ADL ADL Comments: see functional navigator  See Function Navigator for Current Functional Status.   Therapy/Group: Individual Therapy  SCurtis Sites4/20/2019, 12:35 PM

## 2017-07-25 NOTE — Progress Notes (Signed)
Occupational Therapy Session Note  Patient Details  Name: Darrell KollerKelly M Shiflet MRN: [id number] Date of Birth: 03/15/1976  Today's Date: 07/25/2017 OT Individual Time:  - 0900-0945   (45 min)      Short Term Goals: Week 1:  OT Short Term Goal 1 (Week 1): Pt will transfer to wide drop arm BSC with mod A  OT Short Term Goal 2 (Week 1): Pt will perform toileting with mod A OT Short Term Goal 3 (Week 1): Pt will navigate his w/c around room to obtain things with min A   Skilled Therapeutic Interventions/Progress Updates:    Pt resting in bed upon arrival.    He reported having bowel issues all night and did not want to get up at this point.  OT intervention with focus on bathing/dressing at bed level.  Bathed supine with set up I.  Ppt did UB, anterior peri area with set up assist.  SEE Function for details.   Pt bridges to pull up shorts using trapeze bar.  He was able to lift buttocks off the bed about  75 % of the way.  OT pulls up shorts.  Pt unable to tolerate pressure of shorts on body.   Remove shorts.  Empted 325 CC of urine from catheter.   Left pt in bed with all needs provided.      Therapy Documentation Precautions:  Precautions Precautions: Fall Restrictions Weight Bearing Restrictions: Yes RLE Weight Bearing: Non weight bearing LLE Weight Bearing: Non weight bearing      Pain: Pain Assessment Pain Scale: 0-10 Pain Score: 3  Pain Type: Acute pain Pain Location: Hip Pain Orientation: Right Pain Onset: With Activity Pain Intervention(s): Repositioned;Medication (See eMAR)(scheduled tylenol) ADL: ADL ADL Comments: see functional navigator   See Function Navigator for Current Functional Status.   Therapy/Group: Individual Therapy  Humberto Sealsdwards, Dickie Labarre J 07/25/2017, 9:16 AM
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
## 2017-07-26 ENCOUNTER — Inpatient Hospital Stay (HOSPITAL_COMMUNITY): Payer: No Typology Code available for payment source

## 2017-07-26 ENCOUNTER — Inpatient Hospital Stay (HOSPITAL_COMMUNITY): Payer: Self-pay

## 2017-07-26 LAB — PROTIME-INR
INR: 1.4
Prothrombin Time: 17.1 seconds — ABNORMAL HIGH (ref 11.4–15.2)

## 2017-07-26 MED ORDER — WARFARIN SODIUM 5 MG PO TABS
5.0000 mg | ORAL_TABLET | Freq: Once | ORAL | Status: AC
  Administered 2017-07-26: 5 mg via ORAL
  Filled 2017-07-26: qty 1

## 2017-07-26 MED ORDER — LISINOPRIL 5 MG PO TABS
15.0000 mg | ORAL_TABLET | Freq: Every day | ORAL | Status: DC
  Administered 2017-07-27 – 2017-08-01 (×6): 15 mg via ORAL
  Filled 2017-07-26 (×6): qty 1

## 2017-07-26 NOTE — Progress Notes (Signed)
Occupational Therapy Session Note  Patient Details  Name: Elizebeth KollerKelly M Grochowski MRN: [id number] Date of Birth: 11/15/1975  Today's Date: 07/26/2017 OT Individual Time: 0700-0810 OT Individual Time Calculation (min): 70 min    Short Term Goals: Week 1:  OT Short Term Goal 1 (Week 1): Pt will transfer to wide drop arm BSC with mod A  OT Short Term Goal 2 (Week 1): Pt will perform toileting with mod A OT Short Term Goal 3 (Week 1): Pt will navigate his w/c around room to obtain things with min A   Skilled Therapeutic Interventions/Progress Updates:    Pt resting in bed upon arrival.  OT intervention with focus on bed mobility, bathing/dressing at bed level, discharge planning, activity tolerance, education, and safety awareness to increase independence with BADLs.  Pt's SO present and assisted during session.  Pt's SO returned home measurement sheet.  Discussed home arrangement and DME recommendations.  Pt will not be able to access bathroom with w/c at home.  Wide drop arm BSC recommended.  Pt requires min A for rolling in bed with pt using trapeze to facilitate.  Pt c/o increased pain with rolling in bed.  Pt required tot A for LB dressing tasks although pt did attempt to pull pants over hips.  Pt remained in bed at end of session with SO present and all needs within reach.   Therapy Documentation Precautions:  Precautions Precautions: Fall Restrictions Weight Bearing Restrictions: Yes RLE Weight Bearing: Non weight bearing LLE Weight Bearing: Non weight bearing Pain:  Pt c/o 7/10 pain in R hip at rest and increased to 10/10 with activity at bed level  See Function Navigator for Current Functional Status.   Therapy/Group: Individual Therapy  Rich BraveLanier, Riki Gehring Chappell 07/26/2017, 8:12 AM

## 2017-07-26 NOTE — Progress Notes (Signed)
ANTICOAGULATION CONSULT NOTE  Pharmacy Consult for warfarin Indication: VTE prophylaxis  Allergies  Allergen Reactions  . Fish Allergy Nausea And Vomiting and Swelling  . Other Nausea And Vomiting and Swelling    All Seafood  . Shellfish Allergy Nausea And Vomiting and Swelling    Patient Measurements: Height: 5\' 5"  (165.1 cm) Weight: 217 lb 8 oz (98.7 kg) IBW/kg (Calculated) : 61.5    Vital Signs: Temp: 98.9 F (37.2 C) (04/21 0526) Temp Source: Oral (04/21 0526) BP: 142/96 (04/21 0526) Pulse Rate: 76 (04/21 0526)  Labs: Recent Labs    07/24/17 0621 07/25/17 0538 07/26/17 0821  LABPROT 14.8 15.1 17.1*  INR 1.17 1.20 1.40     [facility]: [facility] with multiple fractures amongst other medical issues as well. Ortho recommends 8 weeks of warfarin for VTE prophylaxis. Will use prophylactic Lovenox as 'bridge' to therapeutic INR.  INR 1.17 after two doses of coumadin 5 mg last 2 days. Hgb 7.9 down from 8.9, last checked on 4/18.   MD notes no signs of active blood loss.  Rechecking CBC on Monday 4/22. Noted started cipro on 4/17 PM for UTI. Cipro may increase hypothrombinemic effect of coumadin.    INR subtherapeutic today at 1.40 trending up, no bleeding noted today.  Goal of Therapy:  INR 2-3 Monitor platelets by anticoagulation protocol: Yes   Plan:  Give warfarin 5mg  x 1 tonight Daily INR, s/s bleeding Discontinue lovenox when INR >/2  Daylene PoseyJonathan Darl Brisbin, PharmD Pharmacy Resident Pager #: [phone] 07/26/2017 9:21 AM

## 2017-07-26 NOTE — Progress Notes (Signed)
Physical Therapy Session Note  Patient Details  Name: Darrell KollerKelly M Nannini MRN: [id number] Date of Birth: 06/24/1975  Today's Date: 07/26/2017 PT Individual Time: 1045-1200 PT Individual Time Calculation (min): 75 min   Short Term Goals: Week 1:  PT Short Term Goal 1 (Week 1): Pt will complete supine to sit with min assist x 1 consistently PT Short Term Goal 2 (Week 1): Pt will perform least restrictive transfer bed to/from w/c with min assist PT Short Term Goal 3 (Week 1): Pt will propel manual w/c x 50 ft with min assist  Skilled Therapeutic Interventions/Progress Updates:   Functional transfer with posterior technique to transfer OOB to w/c requiring assist with LLE positioning and min to mod assist for scooting due to pain with cues for technique. Rest breaks needed between mobility attempts due to pain increasing in R hip with movement. Total assist for management of legrests for w/c and to bring BLE onto legrests. W/c mobility on unit througout session with supervision for functional UE strengthening and muscular endurance with cues for efficient technique. Initiated attempts for anterior transfer onto mat in gym with initial focus on being able to tolerate position with increasing time able to maintain long sitting position 25 sec, 63 sec and then 1 min 40 seconds with last attempt pt able to lift BLE and position slowing onto mat in preparation for transfer where as first 2 requires assist by PT to perform transitional movement. Also discussed and demonstrated slideboard transfer (with education on importance of maintaining NWB status) which pt may benefit from trial in future session as able to better tolerate BLE in dependent position and anterior transfer puts too much pressure on hips and BLE at this time though improving with tolerance. Also discussed methods for car transfer (front vs back seat, use of slideboard, etc) and recommendation for trial in real car prior to d/c due to specific needs of  patient. Pt in agreement. Need to determine bed height at home due to need for more level transfers and educated on possibility of rental hospital bed if bed height is too high to complete either A/P or slideboard transfer. Also made primary OT aware of this conversation. Pt performed UE ergometer 3 min forward and 3 min backwards on random program level 5 at end of session for UE strengthening and cardiovascular endurance.   Therapy Documentation Precautions:  Precautions Precautions: Fall Restrictions Weight Bearing Restrictions: Yes RLE Weight Bearing: Non weight bearing LLE Weight Bearing: Non weight bearing  Pain: c/o pain in BLE (R > L) - premedicated and repositioned and stretched throughout session.   See Function Navigator for Current Functional Status.   Therapy/Group: Individual Therapy  Karolee StampsGray, Corlis Angelica Darrol PokeBrescia  Myleen Brailsford B. Georgeanne Frankland, PT, DPT  07/26/2017, 12:11 PM
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
## 2017-07-26 NOTE — Progress Notes (Signed)
1 suture removed from R medial thigh. No bleeding noted. Site clean and dry.

## 2017-07-26 NOTE — Progress Notes (Signed)
Occupational Therapy Note  Patient Details  Name: Elizebeth KollerKelly M Blackburn MRN: [id number] Date of Birth: 05/23/1975  Today's Date: 07/26/2017 OT Individual Time: 1300-1345 OT Individual Time Calculation (min): 45 min   Pt c/o 7/10 pain in R hip; RN aware and repositioned Individual Therapy  Pt resting in w/c upon arrival.  OT intervention with focus on w/c mobility, functional transfers, discharge planning, and BUE therex to increase independence with BADLs.  Pt initially propelled w/c to nursing station but c/o R abdominal cramp relieved with rest.  Pt transitioned to ADL apartment to discuss bed transfers.  Pt stated his bed at home was approx same height as bed in ADL apartment.  Pt in agreement that he will need a different bed and possibly a hospital bed.  Discussed slide board transfers. Pt engaged in BUE therex with orange theraband and SciFit (random at load 6) for 5 mins X 2. Pt returned to room and performed anterior transfer to bed with max A +2. Pt remained in bed with all needs within reach.    Lavone NeriLanier, Madesyn [facility] Inc[facility]appell 07/26/2017, 2:44 PM

## 2017-07-27 ENCOUNTER — Inpatient Hospital Stay (HOSPITAL_COMMUNITY): Payer: Self-pay

## 2017-07-27 ENCOUNTER — Inpatient Hospital Stay (HOSPITAL_COMMUNITY): Payer: Self-pay | Admitting: Physical Therapy

## 2017-07-27 LAB — CBC
HCT: 26.6 % — ABNORMAL LOW (ref 39.0–52.0)
Hemoglobin: 8.6 g/dL — ABNORMAL LOW (ref 13.0–17.0)
MCH: 29.3 pg (ref 26.0–34.0)
MCHC: 32.3 g/dL (ref 30.0–36.0)
MCV: 90.5 fL (ref 78.0–100.0)
PLATELETS: 397 10*3/uL (ref 150–400)
RBC: 2.94 MIL/uL — ABNORMAL LOW (ref 4.22–5.81)
RDW: 13.8 % (ref 11.5–15.5)
WBC: 8.7 10*3/uL (ref 4.0–10.5)

## 2017-07-27 LAB — CULTURE, BLOOD (ROUTINE X 2)
CULTURE: NO GROWTH
Culture: NO GROWTH
Special Requests: ADEQUATE

## 2017-07-27 LAB — PROTIME-INR
INR: 1.53
Prothrombin Time: 18.3 seconds — ABNORMAL HIGH (ref 11.4–15.2)

## 2017-07-27 MED ORDER — WARFARIN SODIUM 7.5 MG PO TABS
7.5000 mg | ORAL_TABLET | Freq: Once | ORAL | Status: AC
  Administered 2017-07-27: 7.5 mg via ORAL
  Filled 2017-07-27: qty 1

## 2017-07-27 NOTE — Care Management (Signed)
[facility] Individual Statement of Services  Patient Name:  Darrell KollerKelly M Denardo  Date:  07/27/2017  Welcome [facility] the [facility].  Our goal is to provide you with an individualized program based on your diagnosis and situation, designed to meet your specific needs.  With this comprehensive rehabilitation program, you will be expected to participate in at least 3 hours of rehabilitation therapies Monday-Friday, with modified therapy programming on the weekends.  Your rehabilitation program will include the following services:  Physical Therapy (PT), Occupational Therapy (OT), 24 hour per day rehabilitation nursing, Therapeutic Recreaction (TR), Neuropsychology, Case Management (Social Worker), Rehabilitation Medicine, Nutrition Services and Pharmacy Services  Weekly team conferences will be held on Tuesdays to discuss your progress.  Your Social Worker will talk with you frequently to get your input and to update you on team discussions.  Team conferences with you and your family in attendance may also be held.  Expected length of stay: 10-12 days    Overall anticipated outcome: supervision @ wheelchair level  Depending on your progress and recovery, your program may change. Your Social Worker will coordinate services and will keep you informed of any changes. Your Social Worker's name and contact numbers are listed  below.  The following services may also be recommended but are not provided by the [facility]:   Driving Evaluations  Home Health Rehabiltiation Services  Outpatient Rehabilitation Services  Vocational Rehabilitation   Arrangements will be made to provide these services after discharge if needed.  Arrangements include referral to agencies that provide these services.  Your insurance has been verified to be:  None  Your primary doctor is:  None  (Child psychotherapistsocial worker will assist with locating local medical resource prior to your  discharge.)  Pertinent information will be shared with your doctor and your insurance company.  Social Worker:  DandridgeLucy Jaxon Flatt, [place] [phone] or [phone]) [phone]   Information discussed with and copy given to patient by: Amada JupiterHOYLE, Marliss Buttacavoli, 07/27/2017, 2:18 PM

## 2017-07-27 NOTE — Progress Notes (Signed)
Physical Therapy Session Note  Patient Details  Name: Darrell Wright MRN: [id number] Date of Birth: 1975-09-09  Today's Date: 07/27/2017 PT Individual Time: 7373-6681 PT Individual Time Calculation (min): 55 min   Short Term Goals: Week 1:  PT Short Term Goal 1 (Week 1): Pt will complete supine to sit with min assist x 1 consistently PT Short Term Goal 2 (Week 1): Pt will perform least restrictive transfer bed to/from w/c with min assist PT Short Term Goal 3 (Week 1): Pt will propel manual w/c x 50 ft with min assist  Skilled Therapeutic Interventions/Progress Updates:    C/O 7/10 pain at rest but agreeable to therapy session, declines any intervention for pain at this time, only requests to end session back in bed.    Session focus on slide board transfers and w/c mobility.  Pt's mother present with picture of pt's sectional couch and PT/pt/family discussed benefits of using this for being able to sit upright and to sleep at d/c.  Discussed challenges of slide board transfers uphill, especially with pt being NWB on BLE.    Pt transitions to EOB mod I, and with PT assist to place slide board, pt able to transfer to w/c on L with close supervision.  In therapy apartment, practiced slide board transfer w/c<>low couch.  Discussed differences in this couch (very low and very soft) compared to pt's and problem solved how to handle various challenges with parts management/uneven transfers.  Pt requires mod assist to transfer w/c>couch and max assist to return to w/c.  Discussed trying transfer again in therapy gym on a mat that may better simulate firmness of his furniture and pt agreeable in next session.  PT engaged pt in discussion for d/c planning, including DME and need for ramp to be installed.  Pt verbalized understanding but education to continue to ensure pt's home is ready at time of d/c.  Pt propelled w/c throughout unit with increased time and mod I.  He continues to require some assist for  parts management.  Pt completed slide board transfer back to bed, again with close supervision for this transfer.  Requires mod assist to lift LEs into bed 2/2 pain.  Positioned to comfort with call bell in reach and needs met.    Therapy Documentation Precautions:  Precautions Precautions: Fall Restrictions Weight Bearing Restrictions: Yes RLE Weight Bearing: Non weight bearing LLE Weight Bearing: Non weight bearing  See Function Navigator for Current Functional Status.   Therapy/Group: Individual Therapy  [name] 07/27/2017, 3:56 PM

## 2017-07-27 NOTE — Plan of Care (Signed)
  Problem: Consults Goal: RH GENERAL PATIENT EDUCATION Description See Patient Education module for education specifics. Outcome: Progressing Goal: Skin Care Protocol Initiated - if Braden Score 18 or less Description If consults are not indicated, leave blank or document N/A Outcome: Progressing Goal: Nutrition Consult-if indicated Outcome: Progressing   Problem: RH BOWEL ELIMINATION Goal: RH STG MANAGE BOWEL WITH ASSISTANCE Description STG Manage Bowel with  Min Assistance.  Outcome: Progressing Goal: RH STG MANAGE BOWEL W/MEDICATION W/ASSISTANCE Description STG Manage Bowel with Medication with  Mod I   Outcome: Progressing   Problem: RH BLADDER ELIMINATION Goal: RH STG MANAGE BLADDER WITH EQUIPMENT WITH ASSISTANCE Description STG Manage Bladder With Equipment With mod Assistance  Outcome: Progressing   Problem: RH SKIN INTEGRITY Goal: RH STG SKIN FREE OF INFECTION/BREAKDOWN Description Incisions will be free of infection at discharge  Outcome: Progressing Goal: RH STG MAINTAIN SKIN INTEGRITY WITH ASSISTANCE Description STG Maintain Skin Integrity With  Min Assistance.  Outcome: Progressing Goal: RH STG ABLE TO PERFORM INCISION/WOUND CARE W/ASSISTANCE Description STG Able To Perform Incision/Wound Care With  Mod Assistance.  Outcome: Progressing   Problem: RH SAFETY Goal: RH STG ADHERE TO SAFETY PRECAUTIONS W/ASSISTANCE/DEVICE Description STG Adhere to Safety Precautions With  Min Assistance/Device.  Outcome: Progressing   Problem: RH PAIN MANAGEMENT Goal: RH STG PAIN MANAGED AT OR BELOW PT'S PAIN GOAL Description Less than 4  Outcome: Progressing   Problem: RH KNOWLEDGE DEFICIT GENERAL Goal: RH STG INCREASE KNOWLEDGE OF SELF CARE AFTER HOSPITALIZATION Description Patient will be able to verbalize how to maintain safety  At home  Outcome: Progressing
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
## 2017-07-27 NOTE — Progress Notes (Signed)
Occupational Therapy Session Note  Patient Details  Name: Elizebeth KollerKelly M Martinovich MRN: [id number] Date of Birth: 04/21/1975  Today's Date: 07/27/2017 OT Individual Time: 1000-1100 OT Individual Time Calculation (min): 60 min    Short Term Goals: Week 1:  OT Short Term Goal 1 (Week 1): Pt will transfer to wide drop arm BSC with mod A  OT Short Term Goal 2 (Week 1): Pt will perform toileting with mod A OT Short Term Goal 3 (Week 1): Pt will navigate his w/c around room to obtain things with min A   Skilled Therapeutic Interventions/Progress Updates:    OT intervention with focus on bathing/dressing at bed level, sliding board transfers, grooming at sink, and discharge planning.  Pt requires assistance with donning pants but completes all other tasks at supervision level.  Pt required min A for supine>sit EOB in preparation for sliding board transfer to w/c.  Pt completed sliding board transfer with min A.  Pt states he does not have space in house to place a hospital bed without removing some furniture.  Pt may be able to transfer to couch with recliner for sleeping. Pt has two steps to enter home.  Discussed ramp.  Pt states he has couple of friends who could help bump him up into house.  Will continue with discussions. Pt remained in w/c with all needs within reach.   Therapy Documentation Precautions:  Precautions Precautions: Fall Restrictions Weight Bearing Restrictions: Yes RLE Weight Bearing: Non weight bearing LLE Weight Bearing: Non weight bearing Pain: Pain Assessment Pain Scale: 0-10 Pain Score: 5  Pain Type: Acute pain Pain Location: Hip Pain Orientation: Right Pain Descriptors / Indicators: Aching Pain Onset: On-going Pain Intervention(s): Repositioned ADL: ADL ADL Comments: see functional navigator  See Function Navigator for Current Functional Status.   Therapy/Group: Individual Therapy  Rich BraveLanier, Aimar [place] Chappell 07/27/2017, 12:07 PM

## 2017-07-27 NOTE — Progress Notes (Signed)
Occupational Therapy Note  Patient Details  Name: Darrell Wright MRN: [id number] Date of Birth: 05/02/1975  Today's Date: 07/27/2017 OT Individual Time: 1400-1430 OT Individual Time Calculation (min): 30 min   Pt c/o R hip discomfort (unrated)' repositioned Individual therapy  Pt resting in bed upon arrival.  OT intervention with focus on BUE therex with 8# dumbbells. Overhead presses (10X3), triceps (10X3), and chest presses (10X3). Continued with discharge planning. Pt remained in bed with all needs within reach.    Lavone NeriLanier, Delmo Matty [facility]appell 07/27/2017, 2:41 PM

## 2017-07-27 NOTE — Progress Notes (Signed)
Occupational Therapy Session Note  Patient Details  Name: Darrell Wright MRN: [id number] Date of Birth: [birth year]  Today's Date: 07/27/2017 OT Individual Time: 1100-1156 OT Individual Time Calculation (min): 56 min    Short Term Goals: Week 1:  OT Short Term Goal 1 (Week 1): Pt will transfer to wide drop arm BSC with mod A  OT Short Term Goal 2 (Week 1): Pt will perform toileting with mod A OT Short Term Goal 3 (Week 1): Pt will navigate his w/c around room to obtain things with min A   Skilled Therapeutic Interventions/Progress Updates:    Pt received in w/c agreeable to therapy. Session focused on w/c management, including obstacle negotiation, uneven surfaces, and through doorways with varying entrances/surfaces, as well as B UE endurance in propelling. Pt completed 259f of propulsion before requiring a rest break. Analysis of propulsion method and seated performed, with vc provided re bimanual arm swing and turning around obstacles. Education provided re positioning in elevators with good carryover observed. Pt practiced propelling w/c over uneven surfaces, including carpet, uneven concrete, and a slope. Pt educated re body mechanics to aid in w/c propulsion to minimize overall exertion/work. Demo provided re chair tricep dips to increase B UE strength needed for ADL transfers. Extensive discussion with pt re d/c planning, return to work, and ADL transfers in the home. Pt returned to room and staff member present to remove wound vac. Pt set up for lunch with all needs met.   Therapy Documentation Precautions:  Precautions Precautions: Fall Restrictions Weight Bearing Restrictions: Yes RLE Weight Bearing: Non weight bearing LLE Weight Bearing: Non weight bearing   Vital Signs: Therapy Vitals BP: (!) 148/88 Pain: Pain Assessment Pain Scale: 0-10 Pain Score: 2  Pain Type: Acute pain Pain Location: Hip Pain Orientation: Right Pain Descriptors / Indicators: Aching Pain Onset:  On-going Patients Stated Pain Goal: 3 Pain Intervention(s): Repositioned ADL: ADL ADL Comments: see functional navigator  See Function Navigator for Current Functional Status.   Therapy/Group: Individual Therapy  SCurtis Sites4/22/2019, 12:04 PM
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
## 2017-07-28 ENCOUNTER — Inpatient Hospital Stay (HOSPITAL_COMMUNITY): Payer: Self-pay | Admitting: Physical Therapy

## 2017-07-28 ENCOUNTER — Inpatient Hospital Stay (HOSPITAL_COMMUNITY): Payer: Self-pay

## 2017-07-28 LAB — PROTIME-INR
INR: 1.47
PROTHROMBIN TIME: 17.7 s — AB (ref 11.4–15.2)

## 2017-07-28 MED ORDER — WARFARIN SODIUM 7.5 MG PO TABS
7.5000 mg | ORAL_TABLET | Freq: Once | ORAL | Status: AC
  Administered 2017-07-28: 7.5 mg via ORAL
  Filled 2017-07-28: qty 1

## 2017-07-28 NOTE — Progress Notes (Signed)
Physical Therapy Session Note  Patient Details  Name: Darrell Wright MRN: [id number] Date of Birth: 1975-08-02  Today's Date: 07/28/2017 PT Individual Time: 1300-1400 PT Individual Time Calculation (min): 60 min   Short Term Goals: Week 1:  PT Short Term Goal 1 (Week 1): Pt will complete supine to sit with min assist x 1 consistently PT Short Term Goal 2 (Week 1): Pt will perform least restrictive transfer bed to/from w/c with min assist PT Short Term Goal 3 (Week 1): Pt will propel manual w/c x 50 ft with min assist  Skilled Therapeutic Interventions/Progress Updates:  C/o 6/10 pain but declines medication.  Session focus on community level w/c propulsion, transfers, and pt education regarding pressure relief, HEP, and ongoing d/c planning.  Pt requires supervision for supine>sit, and assist to position slide board for transfer to w/c.  At end of session, pt able to set up transfer with supervision and verbal cues, with minimal assist to steady board initially.  Community level w/c mobility with min verbal cues for path finding.  Pt completes 2x10 reps w/c push ups, LAQ, and hip flexion from w/c level.  Discussed continuing to perform HEP throughout the day to increase flexibility and comfort with mobility with LEs.  PT discussed importance of pressure relief every 30 minutes while upright (in w/c or on couch at home), and pt verbalized understanding. Also discussed possibilities for w/c at d/c and provided pt with hand out for ramp construction.  Pt returned to bed as described above, requires assist for BLEs still 2/2 pain.  Positioned to comfort with call bell in reach and needs met.   Therapy Documentation Precautions:  Precautions Precautions: Fall Restrictions Weight Bearing Restrictions: Yes RLE Weight Bearing: Non weight bearing LLE Weight Bearing: Non weight bearing   See Function Navigator for Current Functional Status.   Therapy/Group: Individual Therapy  [name] 07/28/2017, 2:28 PM
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
## 2017-07-28 NOTE — Progress Notes (Signed)
Physical Therapy Session Note  Patient Details  Name: Darrell Wright MRN: [id number] Date of Birth: 1976/03/30  Today's Date: 07/28/2017 PT Individual Time: 1030-1145 PT Individual Time Calculation (min): 75 min   Short Term Goals: Week 1:  PT Short Term Goal 1 (Week 1): Pt will complete supine to sit with min assist x 1 consistently PT Short Term Goal 2 (Week 1): Pt will perform least restrictive transfer bed to/from w/c with min assist PT Short Term Goal 3 (Week 1): Pt will propel manual w/c x 50 ft with min assist  Skilled Therapeutic Interventions/Progress Updates:   Pt in supine and agreeable to therapy, pain as detailed below. Pt transferred to EOB and to w/c w/ set-up assist only using slide board. Total assist to place slideboard as pt needed assist to lift LE. Self-propelled w/c around unit Mod I using BUEs to work on UE strengthening and endurance. Performed arm ergometer warm-up 5 min in each direction. During rest breaks, performed passive hs, quad, and calf stretching to both legs within pain tolerable range, 3x15 sec hold each. Returned to room and to transferred to EOB and to supine w/ min assist for LE management and uphill transfer. Ended session in supine, call bell within reach and all needs met. Ice applied to bilateral lateral hips.   LE/UE strengthening exercises: -seated calf raises 2x10 -seated toe raises, 2x10 -LAQs within pain-free range, 2x10 -UE boosts in chair w/ 1-2 sec hold, 2x5 -trunk rotations w/ 2kg medicine ball 1x5 in each direction  -10# dowel bicep curls 2x10 -10# dowel shoulder press 2x10 - 10# dowel tricep curl from above head  Therapy Documentation Precautions:  Precautions Precautions: Fall Restrictions Weight Bearing Restrictions: Yes RLE Weight Bearing: Non weight bearing LLE Weight Bearing: Non weight bearing Vital Signs: Therapy Vitals BP: (!) 146/76 Pain: Pain Assessment Pain Scale: 0-10 Pain Score: 5  Pain Type: Acute pain Pain  Location: Hip Pain Descriptors / Indicators: Sharp Pain Intervention(s): Medication (See eMAR);Repositioned  See Function Navigator for Current Functional Status.   Therapy/Group: Individual Therapy  Olie Dibert K Arnette 07/28/2017, 11:46 AM

## 2017-07-28 NOTE — Progress Notes (Signed)
Occupational Therapy Session Note  Patient Details  Name: Darrell Wright MRN: [id number] Date of Birth: 02/14/1976  Today's Date: 07/28/2017 OT Individual Time: 0800-0900 OT Individual Time Calculation (min): 60 min    Short Term Goals: Week 1:  OT Short Term Goal 1 (Week 1): Pt will transfer to wide drop arm BSC with mod A  OT Short Term Goal 2 (Week 1): Pt will perform toileting with mod A OT Short Term Goal 3 (Week 1): Pt will navigate his w/c around room to obtain things with min A   Skilled Therapeutic Interventions/Progress Updates:    OT intervention with focus on bathing/dressing at bed level.  Pt uses reacher to thread pants over hips.  Pt performed supine>sit EOB at supervision level.  Pt required steady A for sliding board transfer to w/c.  Pt c/o lower back pain when sitting EOB and kinesio tape applied for relief.  Pt completed grooming tasks seated in w/c at sink.  Pt requires more than a reasonable amount of time to complete tasks.  Continued discharge planning.   Therapy Documentation Precautions:  Precautions Precautions: Fall Restrictions Weight Bearing Restrictions: Yes RLE Weight Bearing: Non weight bearing LLE Weight Bearing: Non weight bearing Pain: Pain Assessment Pain Score: 4  R hip; repositioned  See Function Navigator for Current Functional Status.   Therapy/Group: Individual Therapy  Rich BraveLanier, Irvin Bastin Chappell 07/28/2017, 2:48 PM

## 2017-07-28 NOTE — Progress Notes (Signed)
ANTICOAGULATION CONSULT NOTE  Pharmacy Consult for warfarin Indication: VTE prophylaxis  Allergies  Allergen Reactions  . Fish Allergy Nausea And Vomiting and Swelling  . Other Nausea And Vomiting and Swelling    All Seafood  . Shellfish Allergy Nausea And Vomiting and Swelling    Patient Measurements: Height: 5\' 5"  (165.1 cm) Weight: 217 lb 8 oz (98.7 kg) IBW/kg (Calculated) : 61.5    Vital Signs: Temp: 99.3 F (37.4 C) (04/23 0440) Temp Source: Oral (04/23 0440) BP: 146/76 (04/23 0758) Pulse Rate: 80 (04/23 0440)  Labs: Recent Labs    07/26/17 0821 07/27/17 0611 07/28/17 0524  HGB  --  8.6*  --   HCT  --  26.6*  --   PLT  --  397  --   LABPROT 17.1* 18.3* 17.7*  INR 1.40 1.53 1.47     [facility]: [facility] fractures amongst other medical issues as well. Ortho recommends 8 weeks of warfarin for VTE prophylaxis. Will use prophylactic Lovenox as 'bridge' to therapeutic INR.   INR subtherapeutic today at 1.47, still not moving much after dose increase yesterday. cipro has been stopped yesterday. no bleeding noted.    Goal of Therapy:  INR 2-3 Monitor platelets by anticoagulation protocol: Yes   Plan:  Give warfarin 7.5mg  x 1 tonight Daily INR, s/s bleeding Discontinue lovenox when INR >2  Thanks!  Bayard HuggerMei Deolinda Frid, PharmD, BCPS  Clinical Pharmacist  Pager: [phone]   07/28/2017 8:48 AM

## 2017-07-29 ENCOUNTER — Inpatient Hospital Stay (HOSPITAL_COMMUNITY): Payer: Self-pay

## 2017-07-29 ENCOUNTER — Inpatient Hospital Stay (HOSPITAL_COMMUNITY): Payer: Self-pay | Admitting: Physical Therapy

## 2017-07-29 LAB — PROTIME-INR
INR: 1.66
Prothrombin Time: 19.4 seconds — ABNORMAL HIGH (ref 11.4–15.2)

## 2017-07-29 MED ORDER — WARFARIN SODIUM 7.5 MG PO TABS
7.5000 mg | ORAL_TABLET | Freq: Once | ORAL | Status: AC
  Administered 2017-07-29: 7.5 mg via ORAL
  Filled 2017-07-29: qty 1

## 2017-07-29 MED ORDER — POLYETHYLENE GLYCOL 3350 17 G PO PACK
17.0000 g | PACK | Freq: Two times a day (BID) | ORAL | Status: DC
  Administered 2017-07-29 – 2017-08-03 (×11): 17 g via ORAL
  Filled 2017-07-29 (×12): qty 1

## 2017-07-29 MED ORDER — FLEET ENEMA 7-19 GM/118ML RE ENEM
1.0000 | ENEMA | Freq: Every day | RECTAL | Status: AC | PRN
  Administered 2017-07-29: 1 via RECTAL

## 2017-07-29 MED ORDER — MAGNESIUM CITRATE PO SOLN
1.0000 | Freq: Once | ORAL | Status: AC
Start: 2017-07-29 — End: 2017-07-29
  Administered 2017-07-29: 1 via ORAL
  Filled 2017-07-29: qty 296

## 2017-07-29 NOTE — Progress Notes (Signed)
ANTICOAGULATION CONSULT NOTE  Pharmacy Consult for warfarin Indication: VTE prophylaxis  Allergies  Allergen Reactions  . Fish Allergy Nausea And Vomiting and Swelling  . Other Nausea And Vomiting and Swelling    All Seafood  . Shellfish Allergy Nausea And Vomiting and Swelling    Patient Measurements: Height: 5\' 5"  (165.1 cm) Weight: 217 lb 8 oz (98.7 kg) IBW/kg (Calculated) : 61.5    Vital Signs: Temp: 97.9 F (36.6 C) (04/24 0234) Temp Source: Oral (04/24 0234) BP: 131/89 (04/24 0234) Pulse Rate: 84 (04/24 0234)  Labs: Recent Labs    07/27/17 0611 07/28/17 0524 07/29/17 0444  HGB 8.6*  --   --   HCT 26.6*  --   --   PLT 397  --   --   LABPROT 18.3* 17.7* 19.4*  INR 1.53 1.47 1.66     [facility]: [facility] fractures amongst other medical issues as well. Ortho recommends 8 weeks of warfarin for VTE prophylaxis. Will use prophylactic Lovenox as 'bridge' to therapeutic INR.   INR subtherapeutic today at 1.66, improving after dose increased to 7.5mg  for the past 2 days. no bleeding noted.    Goal of Therapy:  INR 2-3 Monitor platelets by anticoagulation protocol: Yes   Plan:  Give warfarin 7.5mg  x 1 tonight Daily INR, s/s bleeding Discontinue lovenox when INR >2  Thanks!  Bayard HuggerMei Zuha Dejonge, PharmD, BCPS  Clinical Pharmacist  Pager: [phone]   07/29/2017 9:39 AM

## 2017-07-29 NOTE — Progress Notes (Signed)
Occupational Therapy Weekly Progress Note  Patient Details  Name: Darrell Wright MRN: [id number] Date of Birth: 10/19/1975  Beginning of progress report period: July 23, 2017 End of progress report period: July 29, 2017  Patient has met 3 of 3 short term goals.  Pt has made steady progress with BADLs and functional transfers since admission.  Pt completes LB bathing/dressing tasks at bed level with min A/supervision overall.  Pt performs functional sliding board transfers with supervision/min a.  Pt currently tolerates sitting in w/c approx 45 mins before R hip pain becomes intolerable per patient. Pt requires min A for bed mobility without AE.   Patient continues to demonstrate the following deficits: muscle weakness, decreased cardiorespiratoy endurance and acute pain and decreased sitting balance, decreased balance strategies and difficulty maintaining precautions and therefore will continue to benefit from skilled OT intervention to enhance overall performance with BADL and Reduce care partner burden.  Patient progressing toward long term goals..  Continue plan of care.  OT Short Term Goals Week 1:  OT Short Term Goal 1 (Week 1): Pt will transfer to wide drop arm BSC with mod A  OT Short Term Goal 1 - Progress (Week 1): Met OT Short Term Goal 2 (Week 1): Pt will perform toileting with mod A OT Short Term Goal 2 - Progress (Week 1): Met OT Short Term Goal 3 (Week 1): Pt will navigate his w/c around room to obtain things with min A  OT Short Term Goal 3 - Progress (Week 1): Met Week 2:  OT Short Term Goal 1 (Week 2): STG=LTG secondary to ELOS  Skilled Therapeutic Interventions/Progress Updates:      Therapy Documentation Precautions:  Precautions Precautions: Fall Restrictions Weight Bearing Restrictions: Yes RLE Weight Bearing: Non weight bearing LLE Weight Bearing: Non weight bearing     See Function Navigator for Current Functional Status.     Leotis Shames  [facility] 07/29/2017, 6:42 AM

## 2017-07-29 NOTE — Progress Notes (Signed)
Physical Therapy Session Note  Patient Details  Name: Darrell Wright MRN: [id number] Date of Birth: 05/15/1975  Today's Date: 07/29/2017 PT Individual Time: 1115-1200 PT Individual Time Calculation (min): 45 min   Short Term Goals: Week 1:  PT Short Term Goal 1 (Week 1): Pt will complete supine to sit with min assist x 1 consistently PT Short Term Goal 2 (Week 1): Pt will perform least restrictive transfer bed to/from w/c with min assist PT Short Term Goal 3 (Week 1): Pt will propel manual w/c x 50 ft with min assist  Skilled Therapeutic Interventions/Progress Updates:    Pt asleep in bed, easily arousable and agreeable with encouragement to participate in PT session. Pt reports 10/10 abdominal pain and cramping following bowel medication he had last night. Supine to with with CGA and increased time to complete transfer 2/2 pain. Sliding board transfer bed to w/c with min assist for weight shift and balance. Manual w/c propulsion 2 x 150 ft Mod I for UE endurance. Seated BLE therex x 10 reps: marches, LAQ, ankle pumps. Pt left seated in w/c in room with needs in reach, family present.  Therapy Documentation Precautions:  Precautions Precautions: Fall Restrictions Weight Bearing Restrictions: Yes RLE Weight Bearing: Non weight bearing LLE Weight Bearing: Non weight bearing  See Function Navigator for Current Functional Status.   Therapy/Group: Individual Therapy  Peter Congoaylor Modesty Rudy, PT, DPT  07/29/2017, 4:20 PM
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
## 2017-07-29 NOTE — Progress Notes (Signed)
Physical Therapy Session Note  Patient Details  Name: Darrell KollerKelly M Carino MRN: [id number] Date of Birth: 08/04/1975  Today's Date: 07/29/2017 PT Individual Time: 1300-1345 PT Individual Time Calculation (min): 45 min   Short Term Goals: Week 1:  PT Short Term Goal 1 (Week 1): Pt will complete supine to sit with min assist x 1 consistently PT Short Term Goal 2 (Week 1): Pt will perform least restrictive transfer bed to/from w/c with min assist PT Short Term Goal 3 (Week 1): Pt will propel manual w/c x 50 ft with min assist  Skilled Therapeutic Interventions/Progress Updates:    c/o 9/10 pain on arrival, states mostly from constipation and requesting to return to bed.  Pt able to set up slideboard transfer with cue only to remove armrest for board placement.  Pt completes remainder of set up and transfer with supervision.  Use of leg lifter for sit>supine with min assist for LLE.  PT provided pt with printed HEP for LE therex and pt demonstrates heel slides, hip abd/add to midline, SLR, SAQ, and hamstring stretch with leg lifter.  Discussed continuing to perform daily, and to increase repetitions as pain and fatigue allow.  Pt left positioned to comfort with call bell in reach, missed 15 minutes 2/2 pain.   Therapy Documentation Precautions:  Precautions Precautions: Fall Restrictions Weight Bearing Restrictions: Yes RLE Weight Bearing: Non weight bearing LLE Weight Bearing: Non weight bearing General: PT Amount of Missed Time (min): 15 Minutes PT Missed Treatment Reason: Pain   See Function Navigator for Current Functional Status.   Therapy/Group: Individual Therapy  Stephania FragminCaitlin E Claira Jeter 07/29/2017, 1:51 PM

## 2017-07-29 NOTE — Progress Notes (Signed)
Occupational Therapy Session Note  Patient Details  Name: Darrell Wright MRN: [id number] Date of Birth: 09/17/1975  Today's Date: 07/29/2017 OT Individual Time: [id number] OT Individual Time Calculation (min): 60 min  and Today's Date: 07/29/2017 OT Missed Time: 30 Minutes Missed Time Reason: Pain;Other (comment)(abdominal cramping)   Short Term Goals: Week 2:  OT Short Term Goal 1 (Week 2): STG=LTG secondary to ELOS  Skilled Therapeutic Interventions/Progress Updates:    Pt resting in bed upon arrival.  Pt stated he still has not had a bowel movement and nursing gave him "something to help" the previous night.  Pt donned pants at bed level without assistance using reacher to thread pants. Pt c/o abdominal pain but agreed to transfer to drop amb BSC.  Pt performed sliding board transfer to drop arm BSC with assist to steady board and manage foley catheter.  Pt unsuccessful with moving bowels and c/o increased abdominal pain.  Pt transferred back to bed and used leg lifter to bring legs back onto bed.  Pt repositioned in bed without assistance. Pt missed 30 mins skilled OT services 2/2 pain and discomfort.   Therapy Documentation Precautions:  Precautions Precautions: Fall Restrictions Weight Bearing Restrictions: Yes RLE Weight Bearing: Non weight bearing LLE Weight Bearing: Non weight bearing General: General OT Amount of Missed Time: 30 Minutes   Pain: Pain Assessment Pain Score: 6  Pt c/o abdominal pain from Sorbitol and tingling in BLE/feet RN aware and repositioned See Function Navigator for Current Functional Status.   Therapy/Group: Individual Therapy  Rich BraveLanier, Adisson Deak Chappell 07/29/2017, 9:50 AM

## 2017-07-29 NOTE — Progress Notes (Signed)
Social Work Patient ID: Darrell Wright, male   DOB: 11-23-1975, 42 y.o.   MRN: [id number]   Met with pt yesterday to review team conference.  He is aware and agreeable with targeted d/c date of 4/30 and supervision w/c level overall (some assist with b/d).  We discussed DME and home entry issues.  Pt has info on rental ramps and ramp construction.  I will be able to access [facility] for medication assist, however, still working on securing primary medical care site for pt.  Pt aware possibility he will need to d/c with foley, however, that MD to follow up with urology about this.  Pt remains very motivated.    Darrell Sollenberger, LCSW

## 2017-07-30 ENCOUNTER — Inpatient Hospital Stay (HOSPITAL_COMMUNITY): Payer: Self-pay | Admitting: Occupational Therapy

## 2017-07-30 ENCOUNTER — Inpatient Hospital Stay (HOSPITAL_COMMUNITY): Payer: Self-pay | Admitting: Physical Therapy

## 2017-07-30 ENCOUNTER — Inpatient Hospital Stay (HOSPITAL_COMMUNITY): Payer: Self-pay

## 2017-07-30 LAB — PROTIME-INR
INR: 1.72
Prothrombin Time: 20 seconds — ABNORMAL HIGH (ref 11.4–15.2)

## 2017-07-30 MED ORDER — WARFARIN SODIUM 5 MG PO TABS
10.0000 mg | ORAL_TABLET | Freq: Once | ORAL | Status: AC
  Administered 2017-07-30: 10 mg via ORAL
  Filled 2017-07-30: qty 2

## 2017-07-30 NOTE — Progress Notes (Signed)
Physical Therapy Weekly Progress Note  Patient Details  Name: Darrell Wright MRN: [id number] Date of Birth: 07/04/1975  Beginning of progress report period: July 23, 2017 End of progress report period: July 30, 2017  Today's Date: 07/30/2017 PT Individual Time: 0900-0955 PT Individual Time Calculation (min): 55 min   Patient has met 3 of 3 short term goals.  Pt has made excellent progress with therapy this week as pain has gradually come more under control.  He's currently performing all mobility with close supervision<>min assist and is on track for d/c next week.  Have been discussing home set up, need for ramp, DME etc, in sessions daily.  Patient continues to demonstrate the following deficits muscle weakness and muscle joint tightness and decreased balance strategies and therefore will continue to benefit from skilled PT intervention to increase functional independence with mobility.  Patient progressing toward long term goals..  Continue plan of care.  PT Short Term Goals Week 1:  PT Short Term Goal 1 (Week 1): Pt will complete supine to sit with min assist x 1 consistently PT Short Term Goal 1 - Progress (Week 1): Met PT Short Term Goal 2 (Week 1): Pt will perform least restrictive transfer bed to/from w/c with min assist PT Short Term Goal 2 - Progress (Week 1): Met PT Short Term Goal 3 (Week 1): Pt will propel manual w/c x 50 ft with min assist PT Short Term Goal 3 - Progress (Week 1): Met Week 2:  PT Short Term Goal 1 (Week 2): =LTGs due to ELOS  Skilled Therapeutic Interventions/Progress Updates:    c/o 6/10 pain but declines intervention.  Session focus on slide board transfers and UE strengthening/endurance.  Pt transitions to EOB mod I, and completes slide board transfer to w/c on R with set up assist to bring w/c into position and lock.  Pt completes actual set up of slide board and lateral scoot transfer with supervision and maintains NWB on BLEs throughout.  W/C propulsion  throughout unit, max distance 300', mod I.  PT instructs pt in car transfer with slide board, supervision overall, with verbal cues for set up and sequencing throughout.  Also discussed main differences between simulator car and real car, pt may like to try real car transfer during tomorrow's session if able.  UEB x10 minutes on random level, 5 min forward, and 5 backward for UE strengthening and cardiovascular endurance.  Returned to room at end of session and left upright in w/c to await next PT session.   Therapy Documentation Precautions:  Precautions Precautions: Fall Restrictions Weight Bearing Restrictions: Yes RLE Weight Bearing: Non weight bearing LLE Weight Bearing: Non weight bearing   See Function Navigator for Current Functional Status.  Therapy/Group: Individual Therapy  Caitlin E Warren 07/30/2017, 9:59 AM   

## 2017-07-30 NOTE — Progress Notes (Signed)
Physical Therapy Note  Patient Details  Name: Darrell Wright MRN: [id number] Date of Birth: 05/30/1975 Today's Date: 07/30/2017  1032-1135, 63 min individual tx Pain: 7/10 pelvis, premedicated  W/c propulsion using bil UES on level tile and up/down ramped hall to [address]. [address].  VCs for more efficient turns and propulsion.  Seated therapeutic exercises performed with LEs to increase strength for functional mobility:10 x 1 each R long arc quad knee ext with isometric hold at end range, L long arc quad knee ext, R/L active assistive hip flexion, bil hip adduction against towel roll; 1 x 15 bil hip abduction against orange Theraband, 2 x 10 ankle pumps.  On L sidelying, with pillow between knees, bil hip flex/extension x 10 each.   Slide board transfer to R to firm mat, with pt placing board, locking R brake, removing armrest, and pressing up to slide over.  Pt had muscles spasms bil shoulders as he transferred, and needed to lie down quickly, with min assist for LLE due to difficulty using leg lifter.  Upon return to room, pt requested getting back to bed.  Pt needed set-up placement for slide board, for transfer to L, and min assist to bring LLE onto bed.  Pt left resting in bed with needs at hand.  See function navigator for current status.  Danaija Eskridge 07/30/2017, 10:53 AM
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
## 2017-07-30 NOTE — Progress Notes (Signed)
Occupational Therapy Session Note  Patient Details  Name: Darrell Wright MRN: [id number] Date of Birth: 1975/11/19  Today's Date: 07/30/2017 OT Individual Time: 1300-1415 OT Individual Time Calculation (min): 75 min    Short Term Goals: Week 2:  OT Short Term Goal 1 (Week 2): STG=LTG secondary to ELOS  Skilled Therapeutic Interventions/Progress Updates:    Treatment session focused on ADLs/self care training, transfer training, endurance/strength training, pt education/discharge planning. Pt completed bed bathing for UB and frontal perineal care with Set up assist at bed level. Completed UB dressing with S. Pt declined LB d/b as it was done this AM. Pt instructed on use of AE for LB dressing for d/c home. Pt agreeable to completing bed<>w/c<>BSC transfers with slide board. Therapist discussed technique for [facility] transfer with clothing management for practical toileting scenario as pt did not have to void. Pt completed SBT with CGA from al surfaces to R and to L. Pt demo'ed lateral leans for clothing management while on BSC with S and v/c for technique. While supine in bed pt completed therex for B UE strengthening using 8# DBs in room for focus on tricep strengthening. Therapist discussion about home environment and equipment needs in prep for d/c. Pt c/o mild pain in L hip throughout session.  Pt left resting in bed with needs met at this time.   Therapy Documentation Precautions:  Precautions Precautions: Fall Restrictions Weight Bearing Restrictions: Yes RLE Weight Bearing: Non weight bearing LLE Weight Bearing: Non weight bearing Vital Signs: Therapy Vitals Temp: 98.8 F (37.1 C) Temp Source: Oral Pulse Rate: 98 BP: 109/72 Patient Position (if appropriate): Lying Oxygen Therapy SpO2: 99 % Pain: Pain Assessment Pain Scale: 0-10 Pain Score: 8  Pain Type: Acute pain Pain Location: Hip Pain Orientation: Right Pain Radiating Towards: leg Pain Descriptors / Indicators: Aching Pain  Frequency: Intermittent Pain Onset: On-going Patients Stated Pain Goal: 5 Pain Intervention(s): Medication (See eMAR) Multiple Pain Sites: No ADL: ADL ADL Comments: see functional navigator  See Function Navigator for Current Functional Status.   Therapy/Group: Individual Therapy  Delon Sacramento 07/30/2017, 3:41 PM
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
## 2017-07-31 ENCOUNTER — Inpatient Hospital Stay (HOSPITAL_COMMUNITY): Payer: Self-pay

## 2017-07-31 ENCOUNTER — Inpatient Hospital Stay (HOSPITAL_COMMUNITY): Payer: Self-pay | Admitting: Physical Therapy

## 2017-07-31 ENCOUNTER — Inpatient Hospital Stay (HOSPITAL_COMMUNITY): Payer: Self-pay | Admitting: Occupational Therapy

## 2017-07-31 LAB — CBC
HEMATOCRIT: 28.5 % — AB (ref 39.0–52.0)
Hemoglobin: 9.2 g/dL — ABNORMAL LOW (ref 13.0–17.0)
MCH: 28.9 pg (ref 26.0–34.0)
MCHC: 32.3 g/dL (ref 30.0–36.0)
MCV: 89.6 fL (ref 78.0–100.0)
PLATELETS: 661 10*3/uL — AB (ref 150–400)
RBC: 3.18 MIL/uL — ABNORMAL LOW (ref 4.22–5.81)
RDW: 13.2 % (ref 11.5–15.5)
WBC: 8.9 10*3/uL (ref 4.0–10.5)

## 2017-07-31 LAB — PROTIME-INR
INR: 1.8
Prothrombin Time: 20.8 seconds — ABNORMAL HIGH (ref 11.4–15.2)

## 2017-07-31 MED ORDER — WARFARIN SODIUM 5 MG PO TABS
10.0000 mg | ORAL_TABLET | Freq: Once | ORAL | Status: AC
  Administered 2017-07-31: 10 mg via ORAL
  Filled 2017-07-31: qty 2
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
## 2017-07-31 NOTE — Progress Notes (Signed)
ANTICOAGULATION CONSULT NOTE  Pharmacy Consult for warfarin Indication: VTE prophylaxis  Allergies  Allergen Reactions  . Fish Allergy Nausea And Vomiting and Swelling  . Other Nausea And Vomiting and Swelling    All Seafood  . Shellfish Allergy Nausea And Vomiting and Swelling    Patient Measurements: Height: 5\' 5"  (165.1 cm) Weight: 217 lb 8 oz (98.7 kg) IBW/kg (Calculated) : 61.5    Vital Signs: Temp: 98.6 F (37 C) (04/26 0526) Temp Source: Oral (04/26 0526) BP: 130/84 (04/26 0526) Pulse Rate: 83 (04/26 0526)  Labs: Recent Labs    07/29/17 0444 07/30/17 0558 07/31/17 0435  HGB  --   --  9.2*  HCT  --   --  28.5*  PLT  --   --  661*  LABPROT 19.4* 20.0* 20.8*  INR 1.66 1.72 1.80     [facility]: [facility] amongst other medical issues as well. Ortho recommends 8 weeks of warfarin for VTE prophylaxis. Will use prophylactic Lovenox as 'bridge' to therapeutic INR.   INR subtherapeutic today at 1.8, increasing slowly, dose increased to 10mg  last night. hgb 9.2, pltc 661K. No bleeding noted per chart.   Goal of Therapy:  INR 2-3 Monitor platelets by anticoagulation protocol: Yes   Plan:  Give warfarin 10mg  x 1 tonight Daily INR, s/s bleeding Discontinue lovenox when INR >2  Thanks!  Bayard HuggerMei Vonte Rossin, PharmD, BCPS  Clinical Pharmacist  Pager: [phone]   07/31/2017 1:52 PM

## 2017-07-31 NOTE — Progress Notes (Signed)
Occupational Therapy Note  Patient Details  Name: Darrell Wright MRN: [id number] Date of Birth: [birth year]  Today's Date: 07/31/2017 OT Individual Time: 2774-1287 OT Individual Time Calculation (min): 45 min   C/o pain in lower abdominal muscles (pt stated he strained to have a BM after receiving an enema)  Pt received in w/c discussing his discomfort. Suggested stretching exercises. Pt self propelled to gym and used slide board to mat with min A to set up board and then he transferred with S.  He used leg lifter to bring legs onto mat and then pt layed flat in supine with 1 pillow. he worked on stretching  B arms overhead and then with lateral bends to stretch various areas of lower abs.  He has lumbar kyphosis with legs extended so bolster placed under B knees. Pt stated this helped him quite a bit so recommended pt ask for pillows under his knees when he is in bed.  Pt worked on pelvic tilts and pelvic rotation with moving pelvis L>< R.  No pain reported. Pt cued to engage transverse abdominals.  Pt needed some assist to sit up from fully supine position and min A with leg to EOM.  He use slide board back to w/c with set up. Provided pt with small disposable hot pack to place over sore abdominal area.  Pt returned to room with all needs met.   [place] 07/31/2017, 1:34 PM

## 2017-07-31 NOTE — Progress Notes (Signed)
Physical Therapy Session Note  Patient Details  Name: Elizebeth KollerKelly M Thakur MRN: [id number] Date of Birth: 02/26/1976  Today's Date: 07/31/2017 PT Individual Time: 0830-0900 PT Individual Time Calculation (min): 30 min   Short Term Goals: Week 2:  PT Short Term Goal 1 (Week 2): =LTGs due to ELOS  Skilled Therapeutic Interventions/Progress Updates:    session focused on family education with pt's fiance, Marcelino DusterMichelle with focus on assistance for set up of transfers, positioning and guarding during transfers, and w/c parts management with return demonstration. Pt performed bed mobility from flat bed with supervision with cues for technique for efficiency and adherence to precautions. Set up assist for slideboard placement and performed supervision transfer to w/c with PT education michelle on correct positioning and guarding to prepare if pt were to need assist. Demonstrated and michelle return demonstrated leg rest positioning and pt able to direct care as well. Educated on DME in preparation for d/c and handoff to next PT to practice real car transfer.   Therapy Documentation Precautions:  Precautions Precautions: Fall Restrictions Weight Bearing Restrictions: Yes RLE Weight Bearing: Non weight bearing LLE Weight Bearing: Non weight bearing    Pain:  Premedicated for pain though pt reports he is doing much better.    See Function Navigator for Current Functional Status.   Therapy/Group: Individual Therapy  Karolee StampsGray, Maimouna Rondeau Darrol PokeBrescia  Amere Iott B. Tarek Cravens, PT, DPT  07/31/2017, 10:20 AM

## 2017-07-31 NOTE — Progress Notes (Signed)
Occupational Therapy Session Note  Patient Details  Name: Darrell Wright MRN: [id number] Date of Birth: 01/18/1976  Today's Date: 07/31/2017 OT Individual Time: 1332-1430 OT Individual Time Calculation (min): 58 min    Short Term Goals: Week 2:  OT Short Term Goal 1 (Week 2): STG=LTG secondary to ELOS  Skilled Therapeutic Interventions/Progress Updates:    Pt received sitting up in w/c agreeable to therapy with no c/o pain. Session focused on est of HEP, B UE strength/endurance, and dynamic sitting balance/core strength. 400cc was emptied from pt's foley. Pt propelled self 150 ft to therapy gym and used sliding board to transfer to mat with set up. Pt sat unsupported and completed 2x20 repetitions of trunk rotation exercises with a 5lb dowel. Pt then completed functional reaching task sitting unsupported with weighted dowel, requiring vc for adherence to NWB B LE precautions while reaching forward in all planes of movement. Pt participated in creation of HEP using theraband in order to continue B UE strength/endurance needed to propel w/c and perform slideboard transfers upon d/c. Pt completed 2 sets of 10 ea elbow flexion/extension, scapular retraction, and GH flexion exercises, with a blue theraband, which was also given to pt following session for use in HEP. Pt returned to room and used slideboard to transfer back to bed, requiring CGA d/t fatigue. All needs met and bed alarm set.   Therapy Documentation Precautions:  Precautions Precautions: Fall Restrictions Weight Bearing Restrictions: Yes RLE Weight Bearing: Non weight bearing LLE Weight Bearing: Non weight bearing Pain: Pain Assessment Pain Scale: 0-10 Pain Score: 5  Pain Type: Surgical pain Pain Location: Hip Pain Orientation: Right Pain Descriptors / Indicators: Aching Pain Onset: With Activity Pain Intervention(s): Repositioned ADL: ADL ADL Comments: see functional navigator  See Function Navigator for Current Functional  Status.   Therapy/Group: Individual Therapy  Darrell Wright 07/31/2017, 3:12 PM  

## 2017-07-31 NOTE — Progress Notes (Signed)
Physical Therapy Session Note  Patient Details  Name: Darrell Wright MRN: [id number] Date of Birth: 01-30-1976  Today's Date: 07/31/2017 PT Individual Time: 0900-1000 PT Individual Time Calculation (min): 60 min   Short Term Goals: Week 2:  PT Short Term Goal 1 (Week 2): =LTGs due to ELOS  Skilled Therapeutic Interventions/Progress Updates:    c/o 6/10 pain at rest, declines intervention.  Session focus on family education for real car transfer, and LE therex.    Pt propels w/c throughout unit and in community environment mod I.  Pt demonstrated set up>mod I car transfer with slide board and self directed care to his significant other with occasional cues from PT for clarity.  Pt returned to therapy gym and transferred to therapy mat with supervision.  Pt completes BLE therex 2x15 reps ankle pumps, quad sets, glute sets, and heel slides.  Discussed possible strain injury to groin from not being able to stabilize with either LE during therex and using pelvic floor and abdominals to stabilize instead.  Pt returned to room at end of session and positioned in w/c with call bell in reach and needs met.   Therapy Documentation Precautions:  Precautions Precautions: Fall Restrictions Weight Bearing Restrictions: Yes RLE Weight Bearing: Non weight bearing LLE Weight Bearing: Non weight bearing   See Function Navigator for Current Functional Status.   Therapy/Group: Individual Therapy  [name] 07/31/2017, 12:14 PM

## 2017-08-01 LAB — PROTIME-INR
INR: 1.85
PROTHROMBIN TIME: 21.2 s — AB (ref 11.4–15.2)

## 2017-08-01 MED ORDER — WARFARIN SODIUM 7.5 MG PO TABS
7.5000 mg | ORAL_TABLET | Freq: Once | ORAL | Status: AC
  Administered 2017-08-01: 7.5 mg via ORAL
  Filled 2017-08-01: qty 1

## 2017-08-01 MED ORDER — LISINOPRIL 10 MG PO TABS
10.0000 mg | ORAL_TABLET | Freq: Every day | ORAL | Status: DC
  Administered 2017-08-02 – 2017-08-04 (×3): 10 mg via ORAL
  Filled 2017-08-01 (×3): qty 1
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
## 2017-08-01 NOTE — Progress Notes (Signed)
Physical Therapy Session Note  Patient Details  Name: Darrell Wright MRN: [id number] Date of Birth: 1975-11-25  Today's Date: 08/01/2017 PT Individual Time: 1134-1204 PT Individual Time Calculation (min): 30 min   Short Term Goals: Week 2:  PT Short Term Goal 1 (Week 2): =LTGs due to ELOS  Skilled Therapeutic Interventions/Progress Updates:  Pt received in bed & agreeable to tx. Pt transfers to EOB without assistance but with hospital bed features. Pt completes slide board transfer bed>w/c with supervision and instructed therapist in how set up w/c and assist him if necessary. Pt propels w/c room<>apartment with mod I. Pt completes w/c<>rocking recliner with slide board with close supervision and occasional cuing to remove legrests prior to positioning w/c. Pt only required assistance to place slide board for recliner>w/c transfer 2/2 inability to sufficiently place it due to leather seat. At end of session pt left sitting in w/c in room with all needs within reach. RN aware of pt situation.  During session pt able to manage w/c legrests & armrests without assistance.   Therapy Documentation Precautions:  Precautions Precautions: Fall Restrictions Weight Bearing Restrictions: Yes RLE Weight Bearing: Non weight bearing LLE Weight Bearing: Non weight bearing  Pain: Pt reports pain in B hips & R groin but states it is better than yesterday, heat at night is helping, and he is premedicated prior to session.   See Function Navigator for Current Functional Status.   Therapy/Group: Individual Therapy  Sandi Mariscal 08/01/2017, 12:14 PM

## 2017-08-02 ENCOUNTER — Inpatient Hospital Stay (HOSPITAL_COMMUNITY): Payer: Self-pay

## 2017-08-02 LAB — CBC
HEMATOCRIT: 29.7 % — AB (ref 39.0–52.0)
Hemoglobin: 9.6 g/dL — ABNORMAL LOW (ref 13.0–17.0)
MCH: 28.9 pg (ref 26.0–34.0)
MCHC: 32.3 g/dL (ref 30.0–36.0)
MCV: 89.5 fL (ref 78.0–100.0)
Platelets: 767 10*3/uL — ABNORMAL HIGH (ref 150–400)
RBC: 3.32 MIL/uL — AB (ref 4.22–5.81)
RDW: 13.1 % (ref 11.5–15.5)
WBC: 9.3 10*3/uL (ref 4.0–10.5)

## 2017-08-02 LAB — PROTIME-INR
INR: 1.88
Prothrombin Time: 21.5 seconds — ABNORMAL HIGH (ref 11.4–15.2)

## 2017-08-02 MED ORDER — WARFARIN SODIUM 5 MG PO TABS
10.0000 mg | ORAL_TABLET | Freq: Once | ORAL | Status: AC
  Administered 2017-08-02: 10 mg via ORAL
  Filled 2017-08-02: qty 2
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
## 2017-08-02 NOTE — Plan of Care (Signed)
  Problem: RH BOWEL ELIMINATION Goal: RH STG MANAGE BOWEL WITH ASSISTANCE Description STG Manage Bowel with  Min Assistance.  Outcome: Progressing   Problem: RH BOWEL ELIMINATION Goal: RH STG MANAGE BOWEL W/MEDICATION W/ASSISTANCE Description STG Manage Bowel with Medication with  Mod I   Outcome: Progressing   Problem: RH SAFETY Goal: RH STG ADHERE TO SAFETY PRECAUTIONS W/ASSISTANCE/DEVICE Description STG Adhere to Safety Precautions With  Min Assistance/Device.  Outcome: Progressing

## 2017-08-02 NOTE — Progress Notes (Signed)
ANTICOAGULATION CONSULT NOTE - Follow-Up Consult Pharmacy Consult for warfarin Indication: VTE prophylaxis  Allergies  Allergen Reactions  . Fish Allergy Nausea And Vomiting and Swelling  . Other Nausea And Vomiting and Swelling    All Seafood  . Shellfish Allergy Nausea And Vomiting and Swelling   Patient Measurements: Height:  (165.1 cm) Weight: 217 lb 8 oz (98.7 kg) IBW/kg (Calculated) : 61.5   Vital Signs: BP: 140/88 (04/28 0829) Pulse Rate: 80 (04/28 0540)  Labs: Recent Labs    07/31/17 0435 08/01/17 0634 08/02/17 0649  HGB 9.2*  --  9.6*  HCT 28.5*  --  29.7*  PLT 661*  --  767*  LABPROT 20.8* 21.2* 21.5*  INR 1.80 1.85 1.88   Assessment: 42 yoM admitted due to [facility] - here with multiple fractures amongst other medical issues as well. Ortho recommends 8 weeks of warfarin for VTE prophylaxis. Will use prophylactic Lovenox as 'bridge' to therapeutic INR.   INR subtherapeutic today at 1.88, increasing slowly.  Hgb low but stable, PLT high. No bleeding noted.  Goal of Therapy:  INR 2-3 Monitor platelets by anticoagulation protocol: Yes   Plan:  Give warfarin 10 mg x 1 tonight Daily INR, monitor CBC, s/s bleeding Discontinue lovenox when INR >2   Madina Galati L. Marcy Salvo, PharmD, MS PGY1 Pharmacy Resident Pager: [phone]

## 2017-08-02 NOTE — Progress Notes (Signed)
Physical Therapy Session Note  Patient Details  Name: Darrell Wright MRN: [id number] Date of Birth: 03/22/1976  Today's Date: 08/02/2017 PT Individual Time: 1308-6578 PT Individual Time Calculation (min): 57 min   Short Term Goals: Week 2:  PT Short Term Goal 1 (Week 2): =LTGs due to ELOS  Skilled Therapeutic Interventions/Progress Updates:    Pt sidelying in bed upon PT arrival, agreeable to therapy tx and reports pain 6/10 in pelvic region. Pt transferred from supine>sitting EOB Mod I and transferred to w/c with slideboard and supervision/set up. Pt propelled w/c x150 ft from room>gym Mod I using B UEs. Pt performed slideboard transfer from w/c>mat with supervision/set up. Pt transferred to supine Mod I and performed LE therex for strengthening, 2 x 10 of each: assisted SLR, hip abduction, SAQ, heel slides, sidelying hip abduction with knee bent. Pt transferred to sitting Mod I and performed LE therex for strengthening, 2 x 10 each: hamstring curls with theraband, LAQ, ball squeezes, ankle pumps with therapist providing cues for techniques. Pt transferred mat>w/c with slideboard and supervision. Pt propelled w/c back to room Mod I and transferred back to bed with supervision and use of slideboard. Pt left supine in bed at end of session with needs in reach.   Therapy Documentation Precautions:  Precautions Precautions: Fall Restrictions Weight Bearing Restrictions: Yes RLE Weight Bearing: Non weight bearing LLE Weight Bearing: Non weight bearing   See Function Navigator for Current Functional Status.   Therapy/Group: Individual Therapy  Cresenciano Genre, PT, DPT 08/02/2017, 12:59 PM

## 2017-08-03 ENCOUNTER — Inpatient Hospital Stay (HOSPITAL_COMMUNITY): Payer: Self-pay | Admitting: Occupational Therapy

## 2017-08-03 ENCOUNTER — Inpatient Hospital Stay (HOSPITAL_COMMUNITY): Payer: Self-pay | Admitting: Physical Therapy

## 2017-08-03 ENCOUNTER — Inpatient Hospital Stay (HOSPITAL_COMMUNITY): Payer: No Typology Code available for payment source | Admitting: Occupational Therapy

## 2017-08-03 ENCOUNTER — Inpatient Hospital Stay (HOSPITAL_COMMUNITY): Payer: No Typology Code available for payment source

## 2017-08-03 LAB — CBC
HCT: 29.8 % — ABNORMAL LOW (ref 39.0–52.0)
Hemoglobin: 9.5 g/dL — ABNORMAL LOW (ref 13.0–17.0)
MCH: 28.7 pg (ref 26.0–34.0)
MCHC: 31.9 g/dL (ref 30.0–36.0)
MCV: 90 fL (ref 78.0–100.0)
Platelets: 794 10*3/uL — ABNORMAL HIGH (ref 150–400)
RBC: 3.31 MIL/uL — AB (ref 4.22–5.81)
RDW: 13.1 % (ref 11.5–15.5)
WBC: 9.6 10*3/uL (ref 4.0–10.5)

## 2017-08-03 LAB — PROTIME-INR
INR: 2.16
PROTHROMBIN TIME: 24 s — AB (ref 11.4–15.2)

## 2017-08-03 MED ORDER — WARFARIN SODIUM 5 MG PO TABS
10.0000 mg | ORAL_TABLET | ORAL | Status: AC
  Administered 2017-08-03: 10 mg via ORAL
  Filled 2017-08-03: qty 2

## 2017-08-03 MED ORDER — IOTHALAMATE MEGLUMINE 17.2 % UR SOLN
250.0000 mL | Freq: Once | URETHRAL | Status: AC | PRN
  Administered 2017-08-03: 200 mL via INTRAVESICAL

## 2017-08-03 NOTE — Progress Notes (Signed)
ANTICOAGULATION CONSULT NOTE - Follow-Up Consult Pharmacy Consult for warfarin Indication: VTE prophylaxis  Allergies  Allergen Reactions  . Fish Allergy Nausea And Vomiting and Swelling  . Other Nausea And Vomiting and Swelling    All Seafood  . Shellfish Allergy Nausea And Vomiting and Swelling   Patient Measurements: Height:  (165.1 cm) Weight: 217 lb 8 oz (98.7 kg) IBW/kg (Calculated) : 61.5   Vital Signs: Temp: 98.3 F (36.8 C) (04/29 0108) Temp Source: Oral (04/29 0108) BP: 128/76 (04/29 0108) Pulse Rate: 86 (04/29 0108)  Labs: Recent Labs    08/01/17 0634 08/02/17 0649 08/03/17 0523  HGB  --  9.6* 9.5*  HCT  --  29.7* 29.8*  PLT  --  767* 794*  LABPROT 21.2* 21.5* 24.0*  INR 1.85 1.88 2.16   Assessment: Darrell Wright admitted due to [facility] - here with multiple fractures amongst other medical issues as well. Ortho recommends 8 weeks of warfarin for VTE prophylaxis. Will use prophylactic Lovenox as 'bridge' to therapeutic INR.   INR 2.16 today Hgb low but stable, PLT high. No bleeding noted.  Goal of Therapy:  INR 2-3 Monitor platelets by anticoagulation protocol: Yes   Plan:  Repeat  warfarin 10 mg x 1 tonight Daily INR, monitor CBC, s/s bleeding Discontinue lovenox   Thank you Okey Regal, PharmD [phone]

## 2017-08-03 NOTE — Progress Notes (Signed)
Physical Therapy Discharge Summary  Patient Details  Name: Darrell Wright MRN: [id number] Date of Birth: [birth year]  Today's Date: 08/03/2017 PT Individual Time: 1100-1157 PT Individual Time Calculation (min): 57 min    Patient has met 5 of 5 long term goals due to improved activity tolerance, improved balance, improved postural control, increased strength and decreased pain.  Patient to discharge at a wheelchair level Modified Independent.      Recommendation:  Patient will benefit from ongoing skilled PT services in home health followed by outpatient when pt able to bear weight through LEs to continue to advance safe functional mobility, address ongoing impairments in strength, balance, and activity tolerance, and minimize fall risk.  Equipment: 18x18 w/c, slide board  Reasons for discharge: treatment goals met  Patient/family agrees with progress made and goals achieved: Yes   Skilled PT:  Pt with c/o 6/10 pain at rest, declines intervention.  Session focus on d/c assessment, d/c planning and education.  Pt performs bed mobility, bed<>w/c, car transfers, and w/c mobility mod I.  Reviewed HEP with pt, and reviewed DME (pt to get w/c, slide board, BSC, and reports ramp being installed today).  Discussed f/u with HHPT or HEP until able to bear weight through LEs and proceed to outpatient PT.  Pt with no further questions.  Returned to room, checked off for bed<>w/c transfers mod I.  Educated on waiting until after afternoon OT session to determine ability to transfer to [place] [facility] without assist.  Call bell in reach and needs met.   PT Discharge Precautions/Restrictions Precautions Precautions: Fall Restrictions Weight Bearing Restrictions: Yes RLE Weight Bearing: Non weight bearing LLE Weight Bearing: Non weight bearing Vital Signs   Pain Pain Assessment Pain Scale: 0-10 Pain Score: 6  Pain Type: Acute pain Pain Location: Buttocks Pain Orientation: Right Pain Descriptors /  Indicators: Numbness;Aching Pain Frequency: Intermittent Patients Stated Pain Goal: 4 Pain Intervention(s): Emotional support Vision/Perception  Perception Perception: Within Functional Limits Praxis Praxis: Intact  Cognition Overall Cognitive Status: Within Functional Limits for tasks assessed Arousal/Alertness: Awake/alert Orientation Level: Oriented X4 Sustained Attention: Appears intact Selective Attention: Appears intact Memory: Appears intact Awareness: Appears intact Problem Solving: Appears intact Safety/Judgment: Appears intact Sensation Sensation Light Touch: Appears Intact Proprioception: Appears Intact Additional Comments: reports mild numbness in bilateral big toes, and in R glute Coordination Gross Motor Movements are Fluid and Coordinated: Yes Fine Motor Movements are Fluid and Coordinated: Yes Coordination and Movement Description: decr movement in bilateral LEs Motor  Motor Motor: Other (comment) Motor - Discharge Observations: limited mobility 2/2 BLE NWB status  Mobility Bed Mobility Bed Mobility: Supine to Sit;Sit to Supine Supine to Sit: 6: Modified independent (Device/Increase time) Sit to Supine: 6: Modified independent (Device/Increase time) Transfers Transfers: Yes Lateral/Scoot Transfers: 6: Modified independent (Device/Increase time);With slide board Locomotion  Ambulation Ambulation: No Gait Gait: No Stairs / Additional Locomotion Stairs: No Wheelchair Mobility Wheelchair Mobility: Yes Wheelchair Assistance: 6: Modified independent (Device/Increase time) Environmental health practitioner: Both upper extremities Wheelchair Parts Management: Independent Distance: 150  Trunk/Postural Assessment  Cervical Assessment Cervical Assessment: Within Functional Limits Thoracic Assessment Thoracic Assessment: Within Functional Limits Lumbar Assessment Lumbar Assessment: Within Functional Limits(improvement in ability to tolerate sitting with neutral  pelvis, posterior pelvic tilt for pain relief as needed) Postural Control Postural Control: Within Functional Limits  Balance Balance Balance Assessed: Yes Static Sitting Balance Static Sitting - Balance Support: No upper extremity supported;Feet unsupported Static Sitting - Level of Assistance: 6: Modified independent (Device/Increase time) Dynamic Sitting Balance Dynamic  Sitting - Balance Support: Feet unsupported;No upper extremity supported Dynamic Sitting - Level of Assistance: 6: Modified independent (Device/Increase time) Extremity Assessment      RLE Assessment RLE Assessment: Exceptions to [facility] RLE AROM (degrees) Overall AROM Right Lower Extremity: Within functional limits for tasks assessed;Other (Comment)(except slightly limited L hip flexion in sitting) RLE Strength RLE Overall Strength: Due to precautions;Deficits RLE Overall Strength Comments: NWB RLE, unable to assess strength with resistance, at least 3/5 based on AROM assessment LLE Assessment LLE Assessment: Exceptions to [facility] LLE AROM (degrees) Overall AROM Left Lower Extremity: Within functional limits for tasks assessed LLE Strength LLE Overall Strength: Deficits;Due to precautions LLE Overall Strength Comments: LLE NWB unable to formally assess with resistance   See Function Navigator for Current Functional Status.  Darrell Wright 08/03/2017, 11:47 AM

## 2017-08-03 NOTE — Discharge Summary (Signed)
Discharge summary job [id number]

## 2017-08-03 NOTE — Progress Notes (Signed)
Occupational Therapy Discharge Summary  Patient Details  Name: Darrell Wright MRN: [id number] Date of Birth: 10/01/1975  Today's Date: 08/03/2017 OT Individual Time: 1500-1555 OT Individual Time Calculation (min): 55 min    Patient has met 7 of 7 long term goals due to improved activity tolerance, improved balance, postural control and ability to compensate for deficits.  Patient to discharge at overall mod I - S level.    Reasons goals not met: all goals met  Recommendation:  Patient will benefit from ongoing skilled OT services in home health setting to continue to advance functional skills in the area of BADL and iADL.  Equipment: drop arm commode chair  Reasons for discharge: treatment goals met  Patient/family agrees with progress made and goals achieved: Yes   OT Intervention: Upon entering the room, pt supine in bed with no c/o pain this session. OT reviewed recommendations for discharge, equipment, and HHOT expectations. OT answering questions for pt as needed. Pt demonstrated slide board transfer onto drop arm commode chair at mod I level. Pt simulated LB clothing management with lateral leans at mod I level as well. Pt returning to bed without assistance and made mod I in room. RN notified. Pt also demonstrated  UE strengthening HEP with no cues needed for technique. All needs within reach upon exiting the room.   OT Discharge Precautions/Restrictions  Precautions Precautions: Fall Restrictions Weight Bearing Restrictions: Yes RLE Weight Bearing: Non weight bearing LLE Weight Bearing: Non weight bearing Vital Signs Therapy Vitals Temp: 99.1 F (37.3 C) Temp Source: Oral Pulse Rate: 79 Resp: 17 BP: (!) 141/82 Patient Position (if appropriate): Lying Oxygen Therapy SpO2: 99 % O2 Device: Room Air Pain Pain Assessment Pain Score: 0-No pain ADL ADL ADL Comments: see functional navigator Vision Baseline Vision/History: No visual deficits Patient Visual Report:  No change from baseline Praxis Praxis: Intact Cognition Overall Cognitive Status: Within Functional Limits for tasks assessed Arousal/Alertness: Awake/alert Orientation Level: Oriented X4 Sustained Attention: Appears intact Selective Attention: Appears intact Memory: Appears intact Awareness: Appears intact Problem Solving: Appears intact Sensation Sensation Light Touch: Appears Intact Stereognosis: Appears Intact Hot/Cold: Appears Intact Proprioception: Appears Intact Coordination Gross Motor Movements are Fluid and Coordinated: Yes Fine Motor Movements are Fluid and Coordinated: Yes Coordination and Movement Description: decr movement in bilateral LEs Motor  Motor Motor: Other (comment) Motor - Discharge Observations: limited mobility 2/2 BLE NWB status Mobility  Bed Mobility Bed Mobility: Supine to Sit;Sit to Supine Supine to Sit: 6: Modified independent (Device/Increase time) Sit to Supine: 6: Modified independent (Device/Increase time)  Trunk/Postural Assessment  Cervical Assessment Cervical Assessment: Within Functional Limits Thoracic Assessment Thoracic Assessment: Within Functional Limits Lumbar Assessment Lumbar Assessment: Within Functional Limits Postural Control Postural Control: Within Functional Limits Trunk Control: requires BUE support for sitting balance  Balance Balance Balance Assessed: Yes Static Sitting Balance Static Sitting - Balance Support: No upper extremity supported;Feet unsupported Static Sitting - Level of Assistance: 6: Modified independent (Device/Increase time) Dynamic Sitting Balance Dynamic Sitting - Balance Support: Feet unsupported;No upper extremity supported Dynamic Sitting - Level of Assistance: 6: Modified independent (Device/Increase time) Extremity/Trunk Assessment RUE Assessment RUE Assessment: Within Functional Limits LUE Assessment LUE Assessment: Within Functional Limits   See Function Navigator for Current  Functional Status.  Gypsy Decant 08/03/2017, 5:22 PM

## 2017-08-03 NOTE — Plan of Care (Signed)
  Problem: RH PAIN MANAGEMENT Goal: RH STG PAIN MANAGED AT OR BELOW PT'S PAIN GOAL Description Less than 4  Outcome: Not Progressing Consistently rating pain > 4.

## 2017-08-03 NOTE — Progress Notes (Signed)
Occupational Therapy Session Note  Patient Details  Name: Darrell Wright MRN: [id number] Date of Birth: Mar 31, 1976  Today's Date: 08/03/2017 OT Individual Time:  - 7-8:15 Total Minutes=75    Skilled Therapeutic Interventions/Progress Updates: Patient in bed upon approach for OT and agreeed to therapy session though he stated he did not sleep much last night "because my hips hurt worse than usual and my right big toe kept feeling like something was sticking it on the bottom.  I could not get comfortable."   Patient bathed supine in bed (stated he cannot get through his bathroom at home and will likely have to bathe in bed or a chair once he goes home.   Focus was on peribathing and bilateral legs and feet.   After setup of basin of water on patient hi lo table, he was able to self bathe his entire body thereafter (he asked this clinician to bathe his back).    He was able to complete hip flexors and bring his feet and legs toward his trunk to wash, don socks, pull shorts over.  At end of session, patient was able to set up his own tray and eat breakfast independently.  Call bell and phone were within his reach.     Therapy Documentation Precautions:  Precautions Precautions: Fall Restrictions Weight Bearing Restrictions: Yes RLE Weight Bearing: Non weight bearing LLE Weight Bearing: Non weight bearing    Pain:7/10 in bilateral hips it just hurts most of the time and "my left big toe feels numb and irritated and like something is underneath it."  RN had givn meds    :Individual Therapy  Rozelle Logan 08/03/2017, 8:02 AM

## 2017-08-03 NOTE — Progress Notes (Signed)
Patient ID: Darrell Wright, male   DOB: 1975/07/10, 42 y.o.   MRN: [id number]   Darrell Wright is doing well with the foley catheter.   He will have a cystogram today and if there is no extravasation, the foley can be removed.  He will need f/u in my office in 2-3 weeks.
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
## 2017-08-04 LAB — CBC
HCT: 31.4 % — ABNORMAL LOW (ref 39.0–52.0)
Hemoglobin: 10.4 g/dL — ABNORMAL LOW (ref 13.0–17.0)
MCH: 29.6 pg (ref 26.0–34.0)
MCHC: 33.1 g/dL (ref 30.0–36.0)
MCV: 89.5 fL (ref 78.0–100.0)
PLATELETS: 840 10*3/uL — AB (ref 150–400)
RBC: 3.51 MIL/uL — ABNORMAL LOW (ref 4.22–5.81)
RDW: 13.3 % (ref 11.5–15.5)
WBC: 8.5 10*3/uL (ref 4.0–10.5)

## 2017-08-04 LAB — PROTIME-INR
INR: 2.1
Prothrombin Time: 23.4 seconds — ABNORMAL HIGH (ref 11.4–15.2)

## 2017-08-04 MED ORDER — WARFARIN SODIUM 5 MG PO TABS: ORAL_TABLET | ORAL | 11 refills | Status: DC

## 2017-08-04 MED ORDER — METHOCARBAMOL 750 MG PO TABS: 750.0000 mg | ORAL_TABLET | Freq: Four times a day (QID) | ORAL | 0 refills | Status: DC

## 2017-08-04 MED ORDER — MIRABEGRON ER 25 MG PO TB24: 25.0000 mg | ORAL_TABLET | Freq: Every day | ORAL | 1 refills | Status: DC

## 2017-08-04 MED ORDER — ADULT MULTIVITAMIN W/MINERALS CH: 1.0000 | ORAL_TABLET | Freq: Every day | ORAL | Status: DC

## 2017-08-04 MED ORDER — OXYBUTYNIN CHLORIDE 5 MG PO TABS: 5.0000 mg | ORAL_TABLET | Freq: Three times a day (TID) | ORAL | 1 refills | Status: DC

## 2017-08-04 MED ORDER — GABAPENTIN 300 MG PO CAPS: 300.0000 mg | ORAL_CAPSULE | Freq: Two times a day (BID) | ORAL | 1 refills | Status: DC

## 2017-08-04 MED ORDER — OXYCODONE HCL 10 MG PO TABS: 10.0000 mg | ORAL_TABLET | Freq: Four times a day (QID) | ORAL | 0 refills | Status: DC | PRN

## 2017-08-04 MED ORDER — LISINOPRIL 10 MG PO TABS: 10.0000 mg | ORAL_TABLET | Freq: Every day | ORAL | 0 refills | Status: DC

## 2017-08-04 MED ORDER — POLYETHYLENE GLYCOL 3350 17 G PO PACK: 17.0000 g | PACK | Freq: Two times a day (BID) | ORAL | 0 refills | Status: AC

## 2017-08-04 MED ORDER — FOLIC ACID 1 MG PO TABS: 1.0000 mg | ORAL_TABLET | Freq: Every day | ORAL | 0 refills | Status: DC

## 2017-08-04 MED ORDER — TRAMADOL HCL 50 MG PO TABS: 50.0000 mg | ORAL_TABLET | Freq: Four times a day (QID) | ORAL | 0 refills | Status: DC

## 2017-08-04 NOTE — Progress Notes (Signed)
Social Work  Discharge Note  The overall goal for the admission was met for:   Discharge location: Yes - home with girlfriend who, along with pt's mother, can cover 24/7 support.  Length of Stay: Yes - 13 days  Discharge activity level: Yes - supervision at w/c level  Home/community participation: Yes  Services provided included: MD, RD, PT, OT, RN, TR, Pharmacy and SW  Financial Services: uninsured  Follow-up services arranged: Home Health: RN, PT via [facility], DME: 18x18 lighweight w/c with ELRs, cushion, wide drop arm commode, tub bench and 30" transfer board via [facility], Other: enrolled pt into [facility] program for first month medication assistance and Patient/Family has no preference for HH/DME agencies  Comments (or additional information): Pt without primary care provider.  Attempting to connect him with [facility], however, not currently taking new patients.  Pt aware that I will continue to pursue this and contact him when appointment is made.  Patient/Family verbalized understanding of follow-up arrangements: Yes  Individual responsible for coordination of the follow-up plan: pt  Confirmed correct DME delivered: HOYLE, LUCY 08/04/2017    HOYLE, LUCY 

## 2017-08-04 NOTE — Progress Notes (Signed)
Pt being discharged home via wheelchair with family. Pt alert and oriented x4. VSS. Pt c/o no pain at this time. No signs of respiratory distress. Education complete and care plans resolved. No further issues at this time. Pt to follow up with PCP. Tavis Kring R, RN 
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
## 2017-08-06 ENCOUNTER — Telehealth: Payer: Self-pay | Admitting: Registered Nurse

## 2017-08-06 NOTE — Telephone Encounter (Signed)
Pt called back and left [phone].

## 2017-08-06 NOTE — Telephone Encounter (Signed)
Return Mr. Gater call, No answer, left message to return the call.

## 2017-08-06 NOTE — Telephone Encounter (Signed)
Placed a call to Darrell Wright and Darrell Wright, no answer. Left message to return the call.

## 2017-08-07 ENCOUNTER — Telehealth: Payer: Self-pay | Admitting: Registered Nurse

## 2017-08-07 NOTE — Progress Notes (Signed)
Prior to patient's discharge from the hospital home health nurse to been arranged to draw Coumadin while he remained on for DVT prophylaxis to be followed by Montez Morita physician assistant for Dr. Myrene Galas [phone].  [facility] states patient does not have skilled home health need.  Messages were left for patient in regards to contacting Dr. Magdalene Patricia office to continue to follow Coumadin therapy.

## 2017-08-07 NOTE — Telephone Encounter (Signed)
Transitional Care call Transitional Care Call Completed, Appointment Confirmed, Address Confirmed, New Patient Packet Mailed  Patient name: Darrell Wright DOB: 08/01/1975 1. Are you/is patient experiencing any problems since coming home? No a. Are there any questions regarding any aspect of care? No, he reports his bilateral greater toes are swelling. Encouraged to elevate his lower extremities, he denies pain.  2. Are there any questions regarding medications administration/dosing? No a. Are meds being taken as prescribed? Yes b. "Patient should review meds with caller to confirm"  Medication List Reviewed 3. Have there been any falls? No 4. Has Home Health been to the house and/or have they contacted you? Yes, [facility] a. If not, have you tried to contact them? NA b. Can we help you contact them? NA 5. Are bowels and bladder emptying properly? Yes a. Are there any unexpected incontinence issues? No b. If applicable, is patient following bowel/bladder programs? NA 6. Any fevers, problems with breathing, unexpected pain? No 7. Are there any skin problems or new areas of breakdown? No 8. Has the patient/family member arranged specialty MD follow up (ie cardiology/neurology/renal/surgical/etc.)?  Mr. [name] was encouraged to make follow up appointments, he verbalizes understanding.  a. Can we help arrange? NA 9. Does the patient need any other services or support that we can help arrange? No. This provider will call [facility] to make sure his INR will be drawn today. This provider sent email to [facility] regarding PCP. [name] is awre of the above and verbalizes understanding.  10. Are caregivers following through as expected in assisting the patient? Yes 11. Has the patient quit smoking, drinking alcohol, or using drugs as recommended? [name] denies smoking, drinking alcohol or using illicit drugs.   Appointment date/time 08/10/2017 arrival time 02:40 aapointment time 3:00 with Dr.  Riley Kill. At [address] [facility] [address]

## 2017-08-07 NOTE — Telephone Encounter (Signed)
This provider was in contact with Darrell Wright, he reports Mr. Asch orthopedist will be drawing and following the INR.

## 2017-08-07 NOTE — Progress Notes (Signed)
Social Work Patient ID: Darrell Wright, male   DOB: 11-Aug-1975, 42 y.o.   MRN: [id number] Spoke with [facility] regarding pt's home health services. She reports PT went out and pt was independent and not considered homebound.They can not provide services if there is no need. He will need to go to MD's office for the blood draw-INR. Have informed Dan-PA of this.
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
## 2017-08-10 ENCOUNTER — Encounter: Payer: Self-pay | Attending: Registered Nurse | Admitting: Registered Nurse

## 2017-08-10 ENCOUNTER — Encounter: Payer: Self-pay | Admitting: Registered Nurse

## 2017-08-10 ENCOUNTER — Encounter: Payer: Self-pay | Admitting: Physical Medicine & Rehabilitation

## 2017-08-10 VITALS — BP 149/105 | HR 78 | Ht 70.0 in | Wt 190.0 lb

## 2017-08-10 DIAGNOSIS — S32810A Multiple fractures of pelvis with stable disruption of pelvic ring, initial encounter for closed fracture: Secondary | ICD-10-CM | POA: Insufficient documentation

## 2017-08-10 DIAGNOSIS — Z87891 Personal history of nicotine dependence: Secondary | ICD-10-CM | POA: Insufficient documentation

## 2017-08-10 DIAGNOSIS — M792 Neuralgia and neuritis, unspecified: Secondary | ICD-10-CM

## 2017-08-10 DIAGNOSIS — S01511A Laceration without foreign body of lip, initial encounter: Secondary | ICD-10-CM

## 2017-08-10 DIAGNOSIS — M62838 Other muscle spasm: Secondary | ICD-10-CM

## 2017-08-10 DIAGNOSIS — T148XXA Other injury of unspecified body region, initial encounter: Secondary | ICD-10-CM

## 2017-08-10 DIAGNOSIS — S31000A Unspecified open wound of lower back and pelvis without penetration into retroperitoneum, initial encounter: Secondary | ICD-10-CM

## 2017-08-10 DIAGNOSIS — Z79899 Other long term (current) drug therapy: Secondary | ICD-10-CM | POA: Insufficient documentation

## 2017-08-10 DIAGNOSIS — R52 Pain, unspecified: Secondary | ICD-10-CM

## 2017-08-10 DIAGNOSIS — I82409 Acute embolism and thrombosis of unspecified deep veins of unspecified lower extremity: Secondary | ICD-10-CM | POA: Insufficient documentation

## 2017-08-10 DIAGNOSIS — S3330XA Dislocation of unspecified parts of lumbar spine and pelvis, initial encounter: Secondary | ICD-10-CM

## 2017-08-10 DIAGNOSIS — I82401 Acute embolism and thrombosis of unspecified deep veins of right lower extremity: Secondary | ICD-10-CM

## 2017-08-10 DIAGNOSIS — Z9889 Other specified postprocedural states: Secondary | ICD-10-CM | POA: Insufficient documentation

## 2017-08-10 DIAGNOSIS — T07XXXA Unspecified multiple injuries, initial encounter: Secondary | ICD-10-CM

## 2017-08-10 DIAGNOSIS — I10 Essential (primary) hypertension: Secondary | ICD-10-CM

## 2017-08-10 DIAGNOSIS — S32409A Unspecified fracture of unspecified acetabulum, initial encounter for closed fracture: Secondary | ICD-10-CM

## 2017-08-10 LAB — PROTIME-INR
INR: 1.5 — ABNORMAL HIGH (ref 0.8–1.2)
Prothrombin Time: 15 s — ABNORMAL HIGH (ref 9.1–12.0)

## 2017-08-10 NOTE — Progress Notes (Signed)
Subjective:    Patient ID: Darrell Wright, male    DOB: November 19, 1975, 42 y.o.   MRN: [id number]  HPI: [name] is a 42 year old male who is here for Transitional Care Visit in follow up of his left acetabular fracture/ OaRIF, Hypertension and DVT Prophylaxis prescribed coumadin. He has been home with Home Health therapies with [facility], his physical therapy has been placed on home due to non weight bearing bilateral lower extremities for 8 weeks.  Reports good appetite.  He states he has pain in his bilateral hips and bilateral feet with tingling . He rates his pain 7. His current stretching exercises to upper extremities, he remains non weight bearing to lower extremities.  PT/INR ordered today and results will be sent to Dr. Carola Frost office/ Montez Morita PA.  Girlfriend in the room, all questions answered.   Pain Inventory Average Pain 7 Pain Right Now 7 My pain is tingling and aching  In the last 24 hours, has pain interfered with the following? General activity 10 Relation with others 10 Enjoyment of life 6 What TIME of day is your pain at its worst? night Sleep (in general) [place]  Pain is worse with: bending, sitting and inactivity Pain improves with: rest, heat/ice and medication Relief from Meds: 5  Mobility ability to climb steps?  no do you drive?  no use a wheelchair needs help with transfers transfers alone  Function employed # of hrs/week 40 I need assistance with the following:  dressing, bathing, toileting, meal prep, household duties and shopping  Neuro/Psych bladder control problems bowel control problems weakness numbness tingling trouble walking spasms  Prior Studies Any changes since last visit?  no  Physicians involved in your care Any changes since last visit?  no   No family history on file. Social History   Socioeconomic History  . Marital status: Married    Spouse name: Not on file  . Number of children: Not on file  .  Years of education: Not on file  . Highest education level: Not on file  Occupational History  . Not on file  Social Needs  . Financial resource strain: Not on file  . Food insecurity:    Worry: Not on file    Inability: Not on file  . Transportation needs:    Medical: Not on file    Non-medical: Not on file  Tobacco Use  . Smoking status: Former Smoker    Years: 2.00    Types: Cigarettes  . Smokeless tobacco: Never Used  . Tobacco comment: 3 cig./day  Substance and Sexual Activity  . Alcohol use: Yes  . Drug use: No  . Sexual activity: Not on file  Lifestyle  . Physical activity:    Days per week: Not on file    Minutes per session: Not on file  . Stress: Not on file  Relationships  . Social connections:    Talks on phone: Not on file    Gets together: Not on file    Attends religious service: Not on file    Active member of club or organization: Not on file    Attends meetings of clubs or organizations: Not on file    Relationship status: Not on file  Other Topics Concern  . Not on file  Social History Narrative   ** Merged History Encounter **       Past Surgical History:  Procedure Laterality Date  . BLADDER REPAIR N/A 07/20/2017  Procedure: REPAIR OF BLADDER RUPTURE;  Surgeon: Bjorn Pippin, MD;  Location: [facility] OR;  Service: Urology;  Laterality: N/A;  . FACIAL LACERATION REPAIR N/A 07/19/2017   Procedure: FACIAL LACERATION REPAIR;  Surgeon: Osborn Coho, MD;  Location: [facility] OR;  Service: ENT;  Laterality: N/A;  . INSERTION OF TRACTION PIN Left 07/19/2017   Procedure: INSERTION OF TRACTION PIN;  Surgeon: Bjorn Pippin, MD;  Location: [facility] OR;  Service: Orthopedics;  Laterality: Left;  . NO PAST SURGERIES    . OPEN REDUCTION INTERNAL FIXATION (ORIF) METACARPAL Left 11/09/2013   Procedure: OPEN REDUCTION INTERNAL FIXATION (ORIF) OF LEFT SMALL FINGER METACARPAL FRACTURE    ;  Surgeon: Dairl Ponder, MD;  Location: [facility];  Service: Orthopedics;   Laterality: Left;  . ORIF ACETABULAR FRACTURE Left 07/20/2017   Procedure: OPEN REDUCTION INTERNAL FIXATION (ORIF) ACETABULAR FRACTURE;  Surgeon: Myrene Galas, MD;  Location: [facility] OR;  Service: Orthopedics;  Laterality: Left;  . ORIF PELVIC FRACTURE Right 07/20/2017   Procedure: OPEN REDUCTION INTERNAL FIXATION (ORIF) PELVIC FRACTURE;  Surgeon: Myrene Galas, MD;  Location: [facility] OR;  Service: Orthopedics;  Laterality: Right;   Past Medical History:  Diagnosis Date  . Abrasions of multiple sites 11/03/2013   knees  . Laceration of forearm, left 11/03/2013   sutured  . Metacarpal bone fracture 11/03/2013   left small   BP (!) 149/105   Pulse 78   Ht  (1.778 m)   Wt 190 lb (86.2 kg)   SpO2 98%   BMI 27.26 kg/m   Opioid Risk Score:   Fall Risk Score:  `1  Depression screen PHQ 2/9  No flowsheet data found.   Review of Systems  Constitutional: Positive for unexpected weight change.  HENT: Negative.   Eyes: Negative.   Respiratory: Negative.   Cardiovascular: Positive for leg swelling.  Gastrointestinal: Positive for abdominal pain, constipation and diarrhea.  Endocrine: Negative.   Genitourinary: Negative.   Musculoskeletal: Positive for arthralgias, gait problem, joint swelling and myalgias.  Skin: Negative.   Allergic/Immunologic: Negative.   Neurological: Positive for weakness and numbness.  Hematological: Bruises/bleeds easily.  Psychiatric/Behavioral: Negative.   All other systems reviewed and are negative.      Objective:   Physical Exam  Constitutional: He is oriented to person, place, and time. He appears well-developed and well-nourished.  HENT:  Head: Normocephalic and atraumatic.  Neck: Normal range of motion. Neck supple.  Cardiovascular: Normal rate and regular rhythm.  Pulmonary/Chest: Effort normal and breath sounds normal.  Musculoskeletal:  Normal Muscle Bulk and Muscle testing Reveals: Upper Extremities: Full ROM and Muscle Strength 5/5 Lumbar  Hypersensitivity Bilateral Greater Trochanter Tenderness Lower Extremities: Full ROM and Muscle Strength on Right 4/5 and Left 3/5 Arrived in wheelchair  Neurological: He is alert and oriented to person, place, and time.  Nursing note and vitals reviewed.         Assessment & Plan:  1. 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 Carola Frost Following 2. DVT Prophylaxis: RX: INR: Continue Coumadin: Montez Morita PA Following INR Results 3. Hypertension: Continue Lisinopril: PCP Following. 4. Muscle Spasm: Continue current medication regimen with Robaxin 5. Neuropathic Pain: Continue current medication regimen with gabapentin:  6. Acute Pain: Continue current medication regimen with Tramadol and Oxycodone.  30 Minutes of face to face patient care time was spent during this visit. All questions were encouraged and answered.   F/U in 1 month with Dr Riley Kill.

## 2017-08-10 NOTE — Patient Instructions (Signed)
Please Call office on Friday 08/14/2017, to evaluate medication   [phone]

## 2017-08-11 ENCOUNTER — Telehealth: Payer: Self-pay | Admitting: Registered Nurse

## 2017-08-11 ENCOUNTER — Ambulatory Visit: Payer: Self-pay | Admitting: Registered Nurse

## 2017-08-11 MED ORDER — TRAMADOL HCL 50 MG PO TABS: 50.0000 mg | ORAL_TABLET | Freq: Two times a day (BID) | ORAL | 0 refills | Status: DC | PRN

## 2017-08-11 MED ORDER — OXYCODONE HCL 10 MG PO TABS: 10.0000 mg | ORAL_TABLET | Freq: Two times a day (BID) | ORAL | 0 refills | Status: DC | PRN

## 2017-08-11 NOTE — Telephone Encounter (Signed)
[name] was given instructions regarding coumadin dosing  ( Take two Tablets of Coumadin 5 mg = 10 mg), he verbalizes understanding. He was instructed to have PT INR drawn on Friday 08/14/2017 at [facility].  Prescriptions were sent to [facility], they don't accept the Good [facility] Card.  [facility] called to remove the prescription.  Oxycodone and Tramadol sent to [facility]. [name] is aware of the above.

## 2017-08-11 NOTE — Telephone Encounter (Signed)
PT/INR Results: 1.5: Results were fax to Darrell Morita PA/ Dr. Jonna Wright from Darrell Wright Office called and reports Darrell Wright wants Darrell Wright coumadin increased to 10 mg daily, and recheck INR on Friday 08/14/2017. Placed a call to Darrell Wright, Darrell Wright and other contacts. No answer, awaiting a return call.

## 2017-08-14 ENCOUNTER — Telehealth: Payer: Self-pay | Admitting: Registered Nurse

## 2017-08-14 DIAGNOSIS — I82403 Acute embolism and thrombosis of unspecified deep veins of lower extremity, bilateral: Secondary | ICD-10-CM

## 2017-08-14 LAB — PROTIME-INR
INR: 1.9 — AB (ref 0.8–1.2)
Prothrombin Time: 20.1 s — ABNORMAL HIGH (ref 9.1–12.0)

## 2017-08-14 MED ORDER — WARFARIN SODIUM 5 MG PO TABS: ORAL_TABLET | ORAL | 1 refills | Status: DC

## 2017-08-14 NOTE — Telephone Encounter (Signed)
Lab work ordered, Stat Pt/INR : Will call results to Dr. Carola Frost Office.

## 2017-08-14 NOTE — Telephone Encounter (Signed)
Received a call from Dr. Carola Frost office:  [name]). Darrell Morita PA would like Darrell Wright to remain on Coumadin 10 mg. Darrell Wright was called and instructed to take Coumadin 5 mg ( take two tablets) in the evening. He verbalizes understanding.  Darrell Wright has an appointment with Dr. Carola Frost on 08/19/2017.

## 2017-08-14 NOTE — Telephone Encounter (Signed)
error 

## 2017-08-14 NOTE — Telephone Encounter (Signed)
Received the PT INR results. Placed a call to Dr. Carola Frost office. Awaiting a return call. At this time [name] is on Coumadin 10 mg. Placed a call to [name], regarding the above he verbalizes understanding.

## 2017-08-26 DIAGNOSIS — F17210 Nicotine dependence, cigarettes, uncomplicated: Secondary | ICD-10-CM

## 2017-08-26 DIAGNOSIS — S334XXD Traumatic rupture of symphysis pubis, subsequent encounter: Secondary | ICD-10-CM

## 2017-08-26 DIAGNOSIS — F1010 Alcohol abuse, uncomplicated: Secondary | ICD-10-CM

## 2017-08-26 DIAGNOSIS — S32402D Unspecified fracture of left acetabulum, subsequent encounter for fracture with routine healing: Secondary | ICD-10-CM

## 2017-08-26 DIAGNOSIS — S3339XD Dislocation of other parts of lumbar spine and pelvis, subsequent encounter: Secondary | ICD-10-CM

## 2017-08-26 DIAGNOSIS — Z79891 Long term (current) use of opiate analgesic: Secondary | ICD-10-CM

## 2017-08-26 DIAGNOSIS — S32811D Multiple fractures of pelvis with unstable disruption of pelvic ring, subsequent encounter for fracture with routine healing: Secondary | ICD-10-CM

## 2017-08-26 DIAGNOSIS — I10 Essential (primary) hypertension: Secondary | ICD-10-CM

## 2017-08-26 DIAGNOSIS — Z7901 Long term (current) use of anticoagulants: Secondary | ICD-10-CM

## 2017-08-26 DIAGNOSIS — S01521D Laceration with foreign body of lip, subsequent encounter: Secondary | ICD-10-CM

## 2017-08-26 DIAGNOSIS — S3729XD Other injury of bladder, subsequent encounter: Secondary | ICD-10-CM

## 2017-09-02 ENCOUNTER — Telehealth: Payer: Self-pay

## 2017-09-02 NOTE — Telephone Encounter (Signed)
These are not due until next week.

## 2017-09-02 NOTE — Telephone Encounter (Signed)
Pt called requesting refill on Tramadol and Oxycodone. Last filled Tramadol #60 a 30 day supply on 08/11/17 and Oxycodone #45 a 22 day supply on 08/11/17. Next appt 09/15/17

## 2017-09-04 NOTE — Telephone Encounter (Signed)
Called pt and left message with Dr. Riley Kill response that medication not to be filled until next week.

## 2017-09-15 ENCOUNTER — Encounter: Payer: Self-pay | Attending: Physical Medicine & Rehabilitation | Admitting: Physical Medicine & Rehabilitation

## 2017-09-15 ENCOUNTER — Encounter: Payer: Self-pay | Admitting: Physical Medicine & Rehabilitation

## 2017-09-15 ENCOUNTER — Other Ambulatory Visit: Payer: Self-pay

## 2017-09-15 VITALS — BP 155/105 | HR 87 | Ht 65.0 in | Wt 175.0 lb

## 2017-09-15 DIAGNOSIS — I10 Essential (primary) hypertension: Secondary | ICD-10-CM | POA: Insufficient documentation

## 2017-09-15 DIAGNOSIS — S32810A Multiple fractures of pelvis with stable disruption of pelvic ring, initial encounter for closed fracture: Secondary | ICD-10-CM | POA: Insufficient documentation

## 2017-09-15 DIAGNOSIS — Z9889 Other specified postprocedural states: Secondary | ICD-10-CM | POA: Insufficient documentation

## 2017-09-15 DIAGNOSIS — M792 Neuralgia and neuritis, unspecified: Secondary | ICD-10-CM | POA: Insufficient documentation

## 2017-09-15 DIAGNOSIS — Z79891 Long term (current) use of opiate analgesic: Secondary | ICD-10-CM

## 2017-09-15 DIAGNOSIS — S32482S Displaced dome fracture of left acetabulum, sequela: Secondary | ICD-10-CM

## 2017-09-15 DIAGNOSIS — Z87891 Personal history of nicotine dependence: Secondary | ICD-10-CM | POA: Insufficient documentation

## 2017-09-15 DIAGNOSIS — I82409 Acute embolism and thrombosis of unspecified deep veins of unspecified lower extremity: Secondary | ICD-10-CM | POA: Insufficient documentation

## 2017-09-15 DIAGNOSIS — G894 Chronic pain syndrome: Secondary | ICD-10-CM

## 2017-09-15 DIAGNOSIS — M62838 Other muscle spasm: Secondary | ICD-10-CM | POA: Insufficient documentation

## 2017-09-15 DIAGNOSIS — Z5181 Encounter for therapeutic drug level monitoring: Secondary | ICD-10-CM

## 2017-09-15 DIAGNOSIS — Z79899 Other long term (current) drug therapy: Secondary | ICD-10-CM | POA: Insufficient documentation

## 2017-09-15 MED ORDER — TRAMADOL HCL 50 MG PO TABS: 50.0000 mg | ORAL_TABLET | Freq: Four times a day (QID) | ORAL | 1 refills | Status: DC | PRN

## 2017-09-15 MED ORDER — METHOCARBAMOL 750 MG PO TABS: 750.0000 mg | ORAL_TABLET | Freq: Four times a day (QID) | ORAL | 3 refills | Status: DC

## 2017-09-15 MED ORDER — OXYCODONE HCL 10 MG PO TABS: 10.0000 mg | ORAL_TABLET | Freq: Three times a day (TID) | ORAL | 0 refills | Status: DC | PRN

## 2017-09-15 NOTE — Patient Instructions (Signed)
PLEASE FEEL FREE TO CALL OUR OFFICE WITH ANY PROBLEMS OR QUESTIONS ([phone])      

## 2017-09-15 NOTE — Progress Notes (Signed)
Subjective:    Patient ID: Elizebeth KollerKelly M Real, male    DOB: 04/25/1975, 42 y.o.   MRN: [id number]  HPI   Mr. Duell is here in follow-up of his left acetabular fracture and associated deficits.  He was seen by our nurse practitioner last month and a transitional care visit.  He was receiving home health therapies at the time. He is still NWB BLE but likely will be cleared to advance this week.   He is still having back and hip/leg pain. It often affects sleep.  He has been on limited oxycodone as of late which has exacerbated his symptoms.  He is a bit apprehensive about increased pain when his weightbearing is advanced.  He reports that his bowels and bladder are now working normally.  He had some bladder pain initially when he got home but that is since resolved.  Appetite is good.   Pain Inventory Average Pain 7 Pain Right Now 8 My pain is intermittent, sharp and stabbing  In the last 24 hours, has pain interfered with the following? General activity 7 Relation with others 7 Enjoyment of life 7 What TIME of day is your pain at its worst? morning and evening Sleep (in general) [place]  Pain is worse with: sitting Pain improves with: heat/ice Relief from Meds: 4  Mobility walk with assistance use a wheelchair transfers alone  Function disabled: date disabled n/a  Neuro/Psych weakness numbness trouble walking  Prior Studies Any changes since last visit?  no  Physicians involved in your care Any changes since last visit?  no   No family history on file. Social History   Socioeconomic History  . Marital status: Married    Spouse name: Not on file  . Number of children: Not on file  . Years of education: Not on file  . Highest education level: Not on file  Occupational History  . Not on file  Social Needs  . Financial resource strain: Not on file  . Food insecurity:    Worry: Not on file    Inability: Not on file  . Transportation needs:    Medical: Not on file    Non-medical: Not on file  Tobacco Use  . Smoking status: Former Smoker    Years: 2.00    Types: Cigarettes  . Smokeless tobacco: Never Used  . Tobacco comment: 3 cig./day  Substance and Sexual Activity  . Alcohol use: Yes  . Drug use: No  . Sexual activity: Not on file  Lifestyle  . Physical activity:    Days per week: Not on file    Minutes per session: Not on file  . Stress: Not on file  Relationships  . Social connections:    Talks on phone: Not on file    Gets together: Not on file    Attends religious service: Not on file    Active member of club or organization: Not on file    Attends meetings of clubs or organizations: Not on file    Relationship status: Not on file  Other Topics Concern  . Not on file  Social History Narrative   ** Merged History Encounter **       Past Surgical History:  Procedure Laterality Date  . BLADDER REPAIR N/A 07/20/2017   Procedure: REPAIR OF BLADDER RUPTURE;  Surgeon: Bjorn PippinWrenn, John, MD;  [facility];  Service: Urology;  Laterality: N/A;  . FACIAL LACERATION REPAIR N/A 07/19/2017   Procedure: FACIAL LACERATION REPAIR;  Surgeon: Annalee GentaShoemaker,  Onalee Hua, MD;  Location: [facility] OR;  Service: ENT;  Laterality: N/A;  . INSERTION OF TRACTION PIN Left 07/19/2017   Procedure: INSERTION OF TRACTION PIN;  Surgeon: Bjorn Pippin, MD;  Location: [facility] OR;  Service: Orthopedics;  Laterality: Left;  . NO PAST SURGERIES    . OPEN REDUCTION INTERNAL FIXATION (ORIF) METACARPAL Left 11/09/2013   Procedure: OPEN REDUCTION INTERNAL FIXATION (ORIF) OF LEFT SMALL FINGER METACARPAL FRACTURE    ;  Surgeon: Dairl Ponder, MD;  Location: [facility];  Service: Orthopedics;  Laterality: Left;  . ORIF ACETABULAR FRACTURE Left 07/20/2017   Procedure: OPEN REDUCTION INTERNAL FIXATION (ORIF) ACETABULAR FRACTURE;  Surgeon: Myrene Galas, MD;  Location: [facility] OR;  Service: Orthopedics;  Laterality: Left;  . ORIF PELVIC FRACTURE Right 07/20/2017   Procedure: OPEN REDUCTION  INTERNAL FIXATION (ORIF) PELVIC FRACTURE;  Surgeon: Myrene Galas, MD;  Location: [facility] OR;  Service: Orthopedics;  Laterality: Right;   Past Medical History:  Diagnosis Date  . Abrasions of multiple sites 11/03/2013   knees  . Laceration of forearm, left 11/03/2013   sutured  . Metacarpal bone fracture 11/03/2013   left small   BP (!) 155/105   Pulse 87   Ht 5\' 5"  (1.651 m) Comment: pt reported  Wt 175 lb (79.4 kg) Comment: pt reported, unable to bare weight  SpO2 98%   BMI 29.12 kg/m    Opioid Risk Score:   Fall Risk Score:  `1  Depression screen PHQ 2/9  Depression screen PHQ 2/9 09/15/2017  Decreased Interest 0  Down, Depressed, Hopeless 0  PHQ - 2 Score 0   Review of Systems  Constitutional: Positive for unexpected weight change.  HENT: Negative.   Eyes: Negative.   Respiratory: Negative.   Cardiovascular: Negative.   Gastrointestinal: Negative.   Endocrine: Negative.   Genitourinary: Negative.   Musculoskeletal: Positive for back pain.  Skin: Negative.   Allergic/Immunologic: Negative.   Neurological: Positive for numbness.  Hematological: Negative.   Psychiatric/Behavioral: Positive for sleep disturbance.  All other systems reviewed and are negative.      Objective:   Physical Exam General: No acute distress HEENT: EOMI, oral membranes moist Cards: reg rate  Chest: normal effort Abdomen: Soft, NT, ND Skin: dry, intact Extremities: no edema  Musculoskeletal:   Pain with palpation over left lateral hip and groin.  Low back with some spasm as well in the lower lumbar segments.  He has functional range of motion of the back and hips however. Neurological: He is alert and oriented to person, place, and time.    Strength is generally 5 out of 5 throughout.  Sensation is intact to light touch and pain in all 4 limbs.          Assessment & Plan:  1. 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 Carola Frost  Following  -advance WB this week?  -resume HH therapies pending wb advancement 2. DVT Prophylaxis: RX: INR: Continue Coumadin: Montez Morita PA Following INR Results 3. Hypertension: Continue Lisinopril: PCP Following. 4. Musculoskeletal pain:  RF oxycodone 10mg  q8 prn #90, tramadol q6 prn #90 and robaxin prn today. We will continue the controlled substance monitoring program, this consists of regular clinic visits, examinations, routine drug screening, pill counts as well as use of [place] Controlled Substance Reporting System. [place] today.   -UDS today 5. Neuropathic Pain: Continue current medication regimen with gabapentin: this pain is improving    15 Minutes of  face to face patient care time was spent during this visit. All questions were encouraged and answered. Follow up in 1 month with NP

## 2017-09-22 ENCOUNTER — Telehealth: Payer: Self-pay | Admitting: *Deleted

## 2017-09-22 LAB — TOXASSURE SELECT,+ANTIDEPR,UR

## 2017-09-22 NOTE — Telephone Encounter (Signed)
Urine drug screen for this encounter is consistent for prescribed medication 

## 2017-10-15 ENCOUNTER — Encounter: Payer: Self-pay | Attending: Registered Nurse | Admitting: Registered Nurse

## 2017-10-15 ENCOUNTER — Encounter: Payer: Self-pay | Admitting: Registered Nurse

## 2017-10-15 VITALS — BP 180/127 | HR 80 | Resp 14 | Ht 65.0 in | Wt 192.0 lb

## 2017-10-15 DIAGNOSIS — G894 Chronic pain syndrome: Secondary | ICD-10-CM

## 2017-10-15 DIAGNOSIS — Z79891 Long term (current) use of opiate analgesic: Secondary | ICD-10-CM

## 2017-10-15 DIAGNOSIS — S32810A Multiple fractures of pelvis with stable disruption of pelvic ring, initial encounter for closed fracture: Secondary | ICD-10-CM | POA: Insufficient documentation

## 2017-10-15 DIAGNOSIS — M792 Neuralgia and neuritis, unspecified: Secondary | ICD-10-CM | POA: Insufficient documentation

## 2017-10-15 DIAGNOSIS — T07XXXA Unspecified multiple injuries, initial encounter: Secondary | ICD-10-CM

## 2017-10-15 DIAGNOSIS — M62838 Other muscle spasm: Secondary | ICD-10-CM | POA: Insufficient documentation

## 2017-10-15 DIAGNOSIS — Z5181 Encounter for therapeutic drug level monitoring: Secondary | ICD-10-CM

## 2017-10-15 DIAGNOSIS — Z87891 Personal history of nicotine dependence: Secondary | ICD-10-CM | POA: Insufficient documentation

## 2017-10-15 DIAGNOSIS — I82409 Acute embolism and thrombosis of unspecified deep veins of unspecified lower extremity: Secondary | ICD-10-CM | POA: Insufficient documentation

## 2017-10-15 DIAGNOSIS — Z9889 Other specified postprocedural states: Secondary | ICD-10-CM | POA: Insufficient documentation

## 2017-10-15 DIAGNOSIS — S3330XA Dislocation of unspecified parts of lumbar spine and pelvis, initial encounter: Secondary | ICD-10-CM

## 2017-10-15 DIAGNOSIS — Z79899 Other long term (current) drug therapy: Secondary | ICD-10-CM | POA: Insufficient documentation

## 2017-10-15 DIAGNOSIS — I10 Essential (primary) hypertension: Secondary | ICD-10-CM | POA: Insufficient documentation

## 2017-10-15 DIAGNOSIS — S32482S Displaced dome fracture of left acetabulum, sequela: Secondary | ICD-10-CM

## 2017-10-15 DIAGNOSIS — S31000A Unspecified open wound of lower back and pelvis without penetration into retroperitoneum, initial encounter: Secondary | ICD-10-CM

## 2017-10-15 MED ORDER — LISINOPRIL 10 MG PO TABS: 10.0000 mg | ORAL_TABLET | Freq: Every day | ORAL | 1 refills | Status: DC

## 2017-10-15 MED ORDER — OXYCODONE HCL 10 MG PO TABS: 10.0000 mg | ORAL_TABLET | Freq: Three times a day (TID) | ORAL | 0 refills | Status: DC | PRN

## 2017-10-15 MED ORDER — LISINOPRIL 10 MG PO TABS: 10.0000 mg | ORAL_TABLET | Freq: Every day | ORAL | 0 refills | Status: DC

## 2017-10-15 MED ORDER — TRAMADOL HCL 50 MG PO TABS: 50.0000 mg | ORAL_TABLET | Freq: Three times a day (TID) | ORAL | 1 refills | Status: DC | PRN

## 2017-10-15 NOTE — Patient Instructions (Signed)
Keep a blood pressure Log: in the morning, afternoon and evening:   If Blood Pressure remains elevated: Go to the Emergency Room for Evaluation  Call for a Primary Care Appointment.    Call Office with Blood Pressure Today

## 2017-10-15 NOTE — Progress Notes (Signed)
Subjective:    Patient ID: Darrell Wright, male    DOB: June 16, 1975, 42 y.o.   MRN: [id number]  HPI: Darrell Wright is a 41 year old male who returns for follow up appointment for chronic pain and medication refill. He states his pain is located in his pelvis and lower back. He rates his pain 7. Darrell Wright reports his pain has intensified since he began physical therapy, we will continue with current medication regime. He verbalizes understanding.  His current exercise regime is walking with crutches, pool therapy and attending physical therapy three days a week.  Darrell Wright arrived hypertensive, he refuses ED evaluation. He was instructed to obtain PCP, keep blood pressure Log and Lisinopril resumed until he finds a PCP he verbalizes understanding. He's asymptomatic at this time we reviewed various symptoms and was encouraged to go to Urgent Care or ED for evaluation he verbalizes understanding.   Darrell Wright Morphine Equivalent is 65.45 MME. Last UDS was Performed on 09/15/2017, it was consistent.    Pain Inventory Average Pain 7 Pain Right Now 7 My pain is intermittent and constant  In the last 24 hours, has pain interfered with the following? General activity 7 Relation with others 0 Enjoyment of life 7 What TIME of day is your pain at its worst? morning, night  Sleep (in general) Poor  Pain is worse with: walking, sitting and standing Pain improves with: medication Relief from Meds: 9  Mobility walk with assistance  Function disabled: date disabled .  Neuro/Psych trouble walking  Prior Studies Any changes since last visit?  no  Physicians involved in your care Any changes since last visit?  no   History reviewed. No pertinent family history. Social History   Socioeconomic History  . Marital status: Widowed    Spouse name: Not on file  . Number of children: Not on file  . Years of education: Not on file  . Highest education level: Not on file  Occupational History    . Not on file  Social Needs  . Financial resource strain: Not on file  . Food insecurity:    Worry: Not on file    Inability: Not on file  . Transportation needs:    Medical: Not on file    Non-medical: Not on file  Tobacco Use  . Smoking status: Former Smoker    Years: 2.00    Types: Cigarettes  . Smokeless tobacco: Never Used  . Tobacco comment: 3 cig./day  Substance and Sexual Activity  . Alcohol use: Yes  . Drug use: No  . Sexual activity: Not on file  Lifestyle  . Physical activity:    Days per week: Not on file    Minutes per session: Not on file  . Stress: Not on file  Relationships  . Social connections:    Talks on phone: Not on file    Gets together: Not on file    Attends religious service: Not on file    Active member of club or organization: Not on file    Attends meetings of clubs or organizations: Not on file    Relationship status: Not on file  Other Topics Concern  . Not on file  Social History Narrative   ** Merged History Encounter **       Past Surgical History:  Procedure Laterality Date  . BLADDER REPAIR N/A 07/20/2017   Procedure: REPAIR OF BLADDER RUPTURE;  Surgeon: Bjorn Pippin, MD;  Location: [facility] OR;  Service: Urology;  Laterality: N/A;  . FACIAL LACERATION REPAIR N/A 07/19/2017   Procedure: FACIAL LACERATION REPAIR;  Surgeon: Osborn Coho, MD;  Location[facility] OR;  Service: ENT;  Laterality: N/A;  . INSERTION OF TRACTION PIN Left 07/19/2017   Procedure: INSERTION OF TRACTION PIN;  Surgeon: Bjorn Pippin, MD;  Location: [facility] OR;  Service: Orthopedics;  Laterality: Left;  . NO PAST SURGERIES    . OPEN REDUCTION INTERNAL FIXATION (ORIF) METACARPAL Left 11/09/2013   Procedure: OPEN REDUCTION INTERNAL FIXATION (ORIF) OF LEFT SMALL FINGER METACARPAL FRACTURE    ;  Surgeon: Dairl Ponder, MD;  Location: [facility];  Service: Orthopedics;  Laterality: Left;  . ORIF ACETABULAR FRACTURE Left 07/20/2017   Procedure: OPEN REDUCTION INTERNAL  FIXATION (ORIF) ACETABULAR FRACTURE;  Surgeon: Myrene Galas, MD;  [facility]: [facility];  Service: Orthopedics;  Laterality: Left;  . ORIF PELVIC FRACTURE Right 07/20/2017   Procedure: OPEN REDUCTION INTERNAL FIXATION (ORIF) PELVIC FRACTURE;  Surgeon: Myrene Galas, MD;  Location: [facility] OR;  Service: Orthopedics;  Laterality: Right;   Past Medical History:  Diagnosis Date  . Abrasions of multiple sites 11/03/2013   knees  . Laceration of forearm, left 11/03/2013   sutured  . Metacarpal bone fracture 11/03/2013   left small   BP (!) 161/108   Pulse 74   Resp 14   Ht 5\' 5"  (1.651 m)   Wt 192 lb (87.1 kg)   SpO2 98%   BMI 31.95 kg/m   Opioid Risk Score:   Fall Risk Score:  `1  Depression screen PHQ 2/9  Depression screen PHQ 2/9 09/15/2017  Decreased Interest 0  Down, Depressed, Hopeless 0  PHQ - 2 Score 0    Review of Systems  Constitutional: Negative.   HENT: Negative.   Eyes: Negative.   Respiratory: Negative.   Cardiovascular: Negative.   Gastrointestinal: Negative.   Endocrine: Negative.   Genitourinary: Negative.   Musculoskeletal: Positive for arthralgias, back pain and gait problem.  Skin: Negative.   Allergic/Immunologic: Negative.   Hematological: Negative.   Psychiatric/Behavioral: Negative.        Objective:   Physical Exam  Constitutional: He is oriented to person, place, and time. He appears well-developed and well-nourished.  HENT:  Head: Normocephalic and atraumatic.  Neck: Normal range of motion. Neck supple.  Cardiovascular: Normal rate and regular rhythm.  Pulmonary/Chest: Effort normal and breath sounds normal.  Musculoskeletal:  Normal Muscle Bulk and Muscle Testing Reveals: Upper Extremities: Full ROM and Muscle Strength 5/5 Lumbar Paraspinal Tenderness: L-3-L-5 Mainly Right Side Lower Extremities: Full ROM and Muscle Strength 5/5 Bilateral Lower Extremity Flexion Produces Pain into Pelvis and Lumbar Arises from Table Slowly using Crutches for  Support Antalgic Gait  Neurological: He is alert and oriented to person, place, and time.  Skin: Skin is warm and dry.  Psychiatric: He has a normal mood and affect. His behavior is normal.  Nursing note and vitals reviewed.         Assessment & Plan:  1. 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 Carola Frost Following. 10/15/2017. 2. Uncontrolled Hypertension: Instructed to obtain a PCP, keep blood pressure log, Refuses ED evaluation. RX: Lisinopril: 10/15/2017. 3. Muscle Spasm: Continue current medication regimen with Robaxin. 10/15/2017. 4. Neuropathic Pain: Continue current medication regimen with gabapentin: 10/15/2017. 5. Chronic Pain Syndrome: Refilled: Oxycodone 10 mg one tablet every 8 hours as needed #90.   30 Minutes of face to face patient care time was  spent during this visit. All questions were encouraged and answered.

## 2017-10-20 ENCOUNTER — Telehealth: Payer: Self-pay | Admitting: Registered Nurse

## 2017-10-20 NOTE — Telephone Encounter (Signed)
Follow up  Call once completed to pick up document also.

## 2017-10-20 NOTE — Telephone Encounter (Signed)
New Message  Pts mom left paperwork for Darrell Lefevreunice Wright to fill out for his job for [employer] and to fax once complete.

## 2017-11-13 ENCOUNTER — Encounter: Payer: Self-pay | Attending: Registered Nurse | Admitting: Registered Nurse

## 2017-11-13 ENCOUNTER — Encounter: Payer: Self-pay | Admitting: Registered Nurse

## 2017-11-13 VITALS — BP 136/89 | HR 79 | Resp 14 | Ht 65.0 in | Wt 196.0 lb

## 2017-11-13 DIAGNOSIS — M62838 Other muscle spasm: Secondary | ICD-10-CM

## 2017-11-13 DIAGNOSIS — G894 Chronic pain syndrome: Secondary | ICD-10-CM

## 2017-11-13 DIAGNOSIS — Z5181 Encounter for therapeutic drug level monitoring: Secondary | ICD-10-CM

## 2017-11-13 DIAGNOSIS — T07XXXA Unspecified multiple injuries, initial encounter: Secondary | ICD-10-CM

## 2017-11-13 DIAGNOSIS — Z79899 Other long term (current) drug therapy: Secondary | ICD-10-CM | POA: Insufficient documentation

## 2017-11-13 DIAGNOSIS — S32810A Multiple fractures of pelvis with stable disruption of pelvic ring, initial encounter for closed fracture: Secondary | ICD-10-CM | POA: Insufficient documentation

## 2017-11-13 DIAGNOSIS — S3330XA Dislocation of unspecified parts of lumbar spine and pelvis, initial encounter: Secondary | ICD-10-CM

## 2017-11-13 DIAGNOSIS — Z87891 Personal history of nicotine dependence: Secondary | ICD-10-CM | POA: Insufficient documentation

## 2017-11-13 DIAGNOSIS — I10 Essential (primary) hypertension: Secondary | ICD-10-CM | POA: Insufficient documentation

## 2017-11-13 DIAGNOSIS — Z79891 Long term (current) use of opiate analgesic: Secondary | ICD-10-CM

## 2017-11-13 DIAGNOSIS — Z9889 Other specified postprocedural states: Secondary | ICD-10-CM | POA: Insufficient documentation

## 2017-11-13 DIAGNOSIS — S32482S Displaced dome fracture of left acetabulum, sequela: Secondary | ICD-10-CM

## 2017-11-13 DIAGNOSIS — M792 Neuralgia and neuritis, unspecified: Secondary | ICD-10-CM

## 2017-11-13 DIAGNOSIS — S31000A Unspecified open wound of lower back and pelvis without penetration into retroperitoneum, initial encounter: Secondary | ICD-10-CM

## 2017-11-13 DIAGNOSIS — I82409 Acute embolism and thrombosis of unspecified deep veins of unspecified lower extremity: Secondary | ICD-10-CM | POA: Insufficient documentation

## 2017-11-13 MED ORDER — OXYCODONE HCL 10 MG PO TABS: 10.0000 mg | ORAL_TABLET | Freq: Three times a day (TID) | ORAL | 0 refills | Status: DC | PRN

## 2017-11-13 NOTE — Progress Notes (Signed)
Subjective:    Patient ID: Darrell Wright, male    DOB: November 05, 1975, 42 y.o.   MRN: [id number]  HPI: [name] is a 42 year old male who returns for follow up appointment for chronic pain and medication refill. He states his pain is located in his lower back. He rates his pain 7. His current exercise regime is walking and pool therapy.   [name] reports Dr Carola Frost has released him to return to work in September,  we will begin a slow wean with his oxycodone. He is in agreement and verbalizes understanding.   [name] equivalent is 65.45 MME. Last UDS was Performed on 09/15/2017, it was consistent.    Pain Inventory Average Pain 7 Pain Right Now 7 My pain is sharp and tingling  In the last 24 hours, has pain interfered with the following? General activity 5 Relation with others 9 Enjoyment of life 7 What TIME of day is your pain at its worst? morning, night  Sleep (in general) Poor  Pain is worse with: walking, sitting, standing and some activites Pain improves with: rest, therapy/exercise and medication Relief from Meds: 6  Mobility walk with assistance use a cane how many minutes can you walk? 20 ability to climb steps?  no do you drive?  no transfers alone Do you have any goals in this area?  yes  Function disabled: date disabled . Do you have any goals in this area?  yes  Neuro/Psych numbness spasms depression  Prior Studies Any changes since last visit?  no  Physicians involved in your care Any changes since last visit?  no   History reviewed. No pertinent family history. Social History   Socioeconomic History  . Marital status: Widowed    Spouse name: Not on file  . Number of children: Not on file  . Years of education: Not on file  . Highest education level: Not on file  Occupational History  . Not on file  Social Needs  . Financial resource strain: Not on file  . Food insecurity:    Worry: Not on file    Inability: Not on file    . Transportation needs:    Medical: Not on file    Non-medical: Not on file  Tobacco Use  . Smoking status: Former Smoker    Years: 2.00    Types: Cigarettes  . Smokeless tobacco: Never Used  . Tobacco comment: 3 cig./day  Substance and Sexual Activity  . Alcohol use: Yes  . Drug use: No  . Sexual activity: Not on file  Lifestyle  . Physical activity:    Days per week: Not on file    Minutes per session: Not on file  . Stress: Not on file  Relationships  . Social connections:    Talks on phone: Not on file    Gets together: Not on file    Attends religious service: Not on file    Active member of club or organization: Not on file    Attends meetings of clubs or organizations: Not on file    Relationship status: Not on file  Other Topics Concern  . Not on file  Social History Narrative   ** Merged History Encounter **       Past Surgical History:  Procedure Laterality Date  . BLADDER REPAIR N/A 07/20/2017   Procedure: REPAIR OF BLADDER RUPTURE;  Surgeon: Bjorn Pippin, MD;  Location: [facility] OR;  Service: Urology;  Laterality: N/A;  .  FACIAL LACERATION REPAIR N/A 07/19/2017   Procedure: FACIAL LACERATION REPAIR;  Surgeon: Osborn Coho, MD;  Location: [facility] OR;  Service: ENT;  Laterality: N/A;  . INSERTION OF TRACTION PIN Left 07/19/2017   Procedure: INSERTION OF TRACTION PIN;  Surgeon: Bjorn Pippin, MD;  Location: [facility] OR;  Service: Orthopedics;  Laterality: Left;  . NO PAST SURGERIES    . OPEN REDUCTION INTERNAL FIXATION (ORIF) METACARPAL Left 11/09/2013   Procedure: OPEN REDUCTION INTERNAL FIXATION (ORIF) OF LEFT SMALL FINGER METACARPAL FRACTURE    ;  Surgeon: Dairl Ponder, MD;  Location: [facility];  Service: Orthopedics;  Laterality: Left;  . ORIF ACETABULAR FRACTURE Left 07/20/2017   Procedure: OPEN REDUCTION INTERNAL FIXATION (ORIF) ACETABULAR FRACTURE;  Surgeon: Myrene Galas, MD;  Location: [facility] OR;  Service: Orthopedics;  Laterality: Left;  . ORIF PELVIC  FRACTURE Right 07/20/2017   Procedure: OPEN REDUCTION INTERNAL FIXATION (ORIF) PELVIC FRACTURE;  Surgeon: Myrene Galas, MD;  Location: [facility] OR;  Service: Orthopedics;  Laterality: Right;   Past Medical History:  Diagnosis Date  . Abrasions of multiple sites 11/03/2013   knees  . Laceration of forearm, left 11/03/2013   sutured  . Metacarpal bone fracture 11/03/2013   left small   BP 136/89 (BP Location: Left Arm, Patient Position: Sitting, Cuff Size: Normal)   Pulse 79   Resp 14   Ht 5\' 5"  (1.651 m)   Wt 196 lb (88.9 kg)   SpO2 98%   BMI 32.62 kg/m   Opioid Risk Score:   Fall Risk Score:  `1  Depression screen PHQ 2/9  Depression screen PHQ 2/9 09/15/2017  Decreased Interest 0  Down, Depressed, Hopeless 0  PHQ - 2 Score 0    Review of Systems  Constitutional: Negative.   HENT: Negative.   Eyes: Negative.   Respiratory: Positive for shortness of breath.   Cardiovascular: Positive for leg swelling.  Gastrointestinal: Positive for abdominal pain.  Endocrine: Negative.        High blood sugar  Genitourinary: Negative.   Musculoskeletal: Positive for arthralgias, back pain and gait problem.       Spasms  Skin: Negative.   Allergic/Immunologic: Negative.   Neurological: Positive for numbness.  Hematological: Negative.   Psychiatric/Behavioral: Positive for dysphoric mood.       Objective:   Physical Exam  Constitutional: He is oriented to person, place, and time. He appears well-developed and well-nourished.  HENT:  Head: Normocephalic and atraumatic.  Neck: Normal range of motion. Neck supple.  Pulmonary/Chest: Effort normal and breath sounds normal.  Musculoskeletal:  Normal Muscle Bulk and Muscle Testing Reveals: Upper Extremities: Full ROM and Muscle Strength 5/5 Lumbar Paraspinal Tenderness: L-4-L-5 Mainly Right Side Lower Extremities: Full ROM and Muscle Strength 5/5 Arises from chair slowly, using cane for support Narrow Based Gait  Neurological: He is  alert and oriented to person, place, and time.  Skin: Skin is warm and dry.  Psychiatric: He has a normal mood and affect. His behavior is normal.  Nursing note and vitals reviewed.         Assessment & Plan:  1. 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 Carola Frost Following. 11/13/2017. 2. Hypertension: Controlled: Continue current medication regimen. Awaiting on   PCP appointment, keep blood pressure log,. Continue Lisinopril: 11/13/2017. 3. Muscle Spasm: Continue current medication regimen with Robaxin. 11/13/2017. 4. Neuropathic Pain: Continue current medication regimen with gabapentin: 11/13/2017. 5. Chronic Pain Syndrome: Refilled:Decreased: Begin Slow Weaning:  Oxycodone 10 mg one tablet every 8 hours as needed # 80.   20 Minutes of face to face patient care time was spent during this visit. All questions were encouraged and answered.   F/U in 1 month.

## 2017-11-16 ENCOUNTER — Telehealth: Payer: Self-pay | Admitting: Registered Nurse

## 2017-11-16 MED ORDER — GABAPENTIN 300 MG PO CAPS: 300.0000 mg | ORAL_CAPSULE | Freq: Two times a day (BID) | ORAL | 2 refills | Status: DC

## 2017-11-16 NOTE — Telephone Encounter (Signed)
Patient called and states ET asked him to call back if tingling in foot continued - [facility]

## 2017-11-16 NOTE — Telephone Encounter (Signed)
Mr Darrell Wright is requesting his gabapentin refill to [facility] in [place]. Refill sent and Mr Darrell Wright notified.

## 2017-11-28 ENCOUNTER — Other Ambulatory Visit: Payer: Self-pay | Admitting: Physical Medicine & Rehabilitation

## 2017-11-28 DIAGNOSIS — S32482S Displaced dome fracture of left acetabulum, sequela: Secondary | ICD-10-CM

## 2017-11-28 DIAGNOSIS — M792 Neuralgia and neuritis, unspecified: Secondary | ICD-10-CM

## 2017-11-30 MED ORDER — TRAMADOL HCL 50 MG PO TABS: 50.0000 mg | ORAL_TABLET | Freq: Three times a day (TID) | ORAL | 0 refills | Status: DC | PRN

## 2017-12-10 ENCOUNTER — Other Ambulatory Visit: Payer: Self-pay | Admitting: Registered Nurse

## 2017-12-15 ENCOUNTER — Encounter: Payer: Self-pay | Attending: Registered Nurse | Admitting: Registered Nurse

## 2017-12-15 ENCOUNTER — Encounter: Payer: Self-pay | Admitting: Registered Nurse

## 2017-12-15 VITALS — BP 142/99 | HR 67 | Ht 65.0 in | Wt 200.8 lb

## 2017-12-15 DIAGNOSIS — I82409 Acute embolism and thrombosis of unspecified deep veins of unspecified lower extremity: Secondary | ICD-10-CM | POA: Insufficient documentation

## 2017-12-15 DIAGNOSIS — G894 Chronic pain syndrome: Secondary | ICD-10-CM

## 2017-12-15 DIAGNOSIS — M792 Neuralgia and neuritis, unspecified: Secondary | ICD-10-CM | POA: Insufficient documentation

## 2017-12-15 DIAGNOSIS — Z79899 Other long term (current) drug therapy: Secondary | ICD-10-CM | POA: Insufficient documentation

## 2017-12-15 DIAGNOSIS — S32810A Multiple fractures of pelvis with stable disruption of pelvic ring, initial encounter for closed fracture: Secondary | ICD-10-CM | POA: Insufficient documentation

## 2017-12-15 DIAGNOSIS — S32482S Displaced dome fracture of left acetabulum, sequela: Secondary | ICD-10-CM

## 2017-12-15 DIAGNOSIS — Z5181 Encounter for therapeutic drug level monitoring: Secondary | ICD-10-CM

## 2017-12-15 DIAGNOSIS — S3330XA Dislocation of unspecified parts of lumbar spine and pelvis, initial encounter: Secondary | ICD-10-CM

## 2017-12-15 DIAGNOSIS — M62838 Other muscle spasm: Secondary | ICD-10-CM | POA: Insufficient documentation

## 2017-12-15 DIAGNOSIS — Z79891 Long term (current) use of opiate analgesic: Secondary | ICD-10-CM

## 2017-12-15 DIAGNOSIS — T07XXXA Unspecified multiple injuries, initial encounter: Secondary | ICD-10-CM

## 2017-12-15 DIAGNOSIS — S31000A Unspecified open wound of lower back and pelvis without penetration into retroperitoneum, initial encounter: Secondary | ICD-10-CM

## 2017-12-15 DIAGNOSIS — Z9889 Other specified postprocedural states: Secondary | ICD-10-CM | POA: Insufficient documentation

## 2017-12-15 DIAGNOSIS — Z87891 Personal history of nicotine dependence: Secondary | ICD-10-CM | POA: Insufficient documentation

## 2017-12-15 DIAGNOSIS — I10 Essential (primary) hypertension: Secondary | ICD-10-CM | POA: Insufficient documentation

## 2017-12-15 MED ORDER — OXYCODONE HCL 10 MG PO TABS: 10.0000 mg | ORAL_TABLET | Freq: Three times a day (TID) | ORAL | 0 refills | Status: DC | PRN

## 2017-12-15 MED ORDER — TRAMADOL HCL 50 MG PO TABS: 50.0000 mg | ORAL_TABLET | Freq: Three times a day (TID) | ORAL | 1 refills | Status: DC | PRN

## 2017-12-15 NOTE — Progress Notes (Signed)
Subjective:    Patient ID: Darrell Wright, male    DOB: 11-Jul-1975, 42 y.o.   MRN: [id number]  HPI: [name] is a 42 year old male who returns for follow up appointment for chronic pain and medication refill.[name] He states his pain is located in his lower back. He rates his pain 5. His current exercise regime is walking and attending [employer] 2-3 times a week.   [name] arrived to office hypertensive, he states he is compliant with his medication. Blood pressure was rechecked. He was encouraged to keep a blood pressure log and follow up with his PCP, he verbalizes understanding.   [name] Morphine Equivalent is 60.00 MME. He is tolerating the slow weaning of Oxycodone, we will continue to decrease his tablets today. He verbalizes understanding.   Mr. 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   Pain Inventory Average Pain 6 Pain Right Now 5 My pain is sharp  In the last 24 hours, has pain interfered with the following? General activity 6 Relation with others 6 Enjoyment of life 5 What TIME of day is your pain at its worst? night Sleep (in general) Poor  Pain is worse with: walking, bending and sitting Pain improves with: medication Relief from Meds: .  Mobility walk without assistance ability to climb steps?  yes do you drive?  yes  Function employed # of hrs/week 6  Neuro/Psych No problems in this area  Prior Studies Any changes since last visit?  no  Physicians involved in your care Any changes since last visit?  no   No family history on file. Social History   Socioeconomic History  . Marital status: Widowed    Spouse name: Not on file  . Number of children: Not on file  . Years of education: Not on file  . Highest education level: Not on file  Occupational History  . Not on file  Social Needs  . Financial resource strain: Not on file  . Food insecurity:    Worry: Not on file    Inability:  Not on file  . Transportation needs:    Medical: Not on file    Non-medical: Not on file  Tobacco Use  . Smoking status: Former Smoker    Years: 2.00    Types: Cigarettes  . Smokeless tobacco: Never Used  . Tobacco comment: 3 cig./day  Substance and Sexual Activity  . Alcohol use: Yes  . Drug use: No  . Sexual activity: Not on file  Lifestyle  . Physical activity:    Days per week: Not on file    Minutes per session: Not on file  . Stress: Not on file  Relationships  . Social connections:    Talks on phone: Not on file    Gets together: Not on file    Attends religious service: Not on file    Active member of club or organization: Not on file    Attends meetings of clubs or organizations: Not on file    Relationship status: Not on file  Other Topics Concern  . Not on file  Social History Narrative   ** Merged History Encounter **       Past Surgical History:  Procedure Laterality Date  . BLADDER REPAIR N/A 07/20/2017   Procedure: REPAIR OF BLADDER RUPTURE;  Surgeon: Bjorn Pippin, MD;  Location: [facility] OR;  Service: Urology;  Laterality: N/A;  .  FACIAL LACERATION REPAIR N/A 07/19/2017   Procedure: FACIAL LACERATION REPAIR;  Surgeon: Osborn Coho, MD;  Location: [facility] OR;  Service: ENT;  Laterality: N/A;  . INSERTION OF TRACTION PIN Left 07/19/2017   Procedure: INSERTION OF TRACTION PIN;  Surgeon: Bjorn Pippin, MD;  Location: [facility] OR;  Service: Orthopedics;  Laterality: Left;  . NO PAST SURGERIES    . OPEN REDUCTION INTERNAL FIXATION (ORIF) METACARPAL Left 11/09/2013   Procedure: OPEN REDUCTION INTERNAL FIXATION (ORIF) OF LEFT SMALL FINGER METACARPAL FRACTURE    ;  Surgeon: Dairl Ponder, MD;  Location: [facility];  Service: Orthopedics;  Laterality: Left;  . ORIF ACETABULAR FRACTURE Left 07/20/2017   Procedure: OPEN REDUCTION INTERNAL FIXATION (ORIF) ACETABULAR FRACTURE;  Surgeon: Myrene Galas, MD;  Location: [facility] OR;  Service: Orthopedics;  Laterality: Left;  .  ORIF PELVIC FRACTURE Right 07/20/2017   Procedure: OPEN REDUCTION INTERNAL FIXATION (ORIF) PELVIC FRACTURE;  Surgeon: Myrene Galas, MD;  Location: [facility] OR;  Service: Orthopedics;  Laterality: Right;   Past Medical History:  Diagnosis Date  . Abrasions of multiple sites 11/03/2013   knees  . Laceration of forearm, left 11/03/2013   sutured  . Metacarpal bone fracture 11/03/2013   left small   There were no vitals taken for this visit.  Opioid Risk Score:   Fall Risk Score:  `1  Depression screen PHQ 2/9  Depression screen PHQ 2/9 09/15/2017  Decreased Interest 0  Down, Depressed, Hopeless 0  PHQ - 2 Score 0     Review of Systems  Constitutional: Negative.   HENT: Negative.   Eyes: Negative.   Respiratory: Negative.   Cardiovascular: Negative.   Gastrointestinal: Negative.   Endocrine: Negative.   Genitourinary: Negative.   Musculoskeletal: Positive for arthralgias and myalgias.  Skin: Negative.   Allergic/Immunologic: Negative.   Hematological: Negative.   Psychiatric/Behavioral: Negative.   All other systems reviewed and are negative.      Objective:   Physical Exam  Constitutional: He is oriented to person, place, and time. He appears well-developed and well-nourished.  HENT:  Head: Normocephalic and atraumatic.  Neck: Normal range of motion. Neck supple.  Cardiovascular: Normal rate and regular rhythm.  Pulmonary/Chest: Effort normal and breath sounds normal.  Musculoskeletal:  Normal Muscle Bulk and Muscle Testing Reveals:  Upper Extremities: Full ROM and Muscle Strength 5/5 Lumbar Paraspinal Tenderness: L-4-L-5 Mainly Right Side Lower Extremities: Full ROM and Muscle Strength 5/5 Arises from Table with Ease Narrow Based Gait  Neurological: He is alert and oriented to person, place, and time.  Nursing note and vitals reviewed.         Assessment & Plan:  1. 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  Handy Following. 12/15/2017. 2.Hypertension: Controlled: Continue current medication regimen.  PCP Following: Encouraged tokeep blood pressure log,. ContinueLisinopril: 12/15/2017. 3. Muscle Spasm: Continue current medication regimen with Robaxin. 12/15/2017. 4. Neuropathic Pain: Continue current medication regimen with gabapentin:12/15/2017. 5. Chronic Pain Syndrome: Refilled:Decreased: Continue Slow Weaning:  Oxycodone 10 mg one tablet every 8 hours as needed # 70.  20 Minutes of face to face patient care time was spent during this visit. All questions were encouraged and answered.  F/U in 1 month.

## 2018-01-14 ENCOUNTER — Encounter: Payer: No Typology Code available for payment source | Admitting: Registered Nurse

## 2018-02-15 ENCOUNTER — Encounter
Payer: No Typology Code available for payment source | Attending: Registered Nurse | Admitting: Physical Medicine & Rehabilitation

## 2018-02-15 DIAGNOSIS — S32810A Multiple fractures of pelvis with stable disruption of pelvic ring, initial encounter for closed fracture: Secondary | ICD-10-CM | POA: Insufficient documentation

## 2018-02-15 DIAGNOSIS — M792 Neuralgia and neuritis, unspecified: Secondary | ICD-10-CM | POA: Insufficient documentation

## 2018-02-15 DIAGNOSIS — Z87891 Personal history of nicotine dependence: Secondary | ICD-10-CM | POA: Insufficient documentation

## 2018-02-15 DIAGNOSIS — Z79899 Other long term (current) drug therapy: Secondary | ICD-10-CM | POA: Insufficient documentation

## 2018-02-15 DIAGNOSIS — M62838 Other muscle spasm: Secondary | ICD-10-CM | POA: Insufficient documentation

## 2018-02-15 DIAGNOSIS — Z9889 Other specified postprocedural states: Secondary | ICD-10-CM | POA: Insufficient documentation

## 2018-02-15 DIAGNOSIS — I10 Essential (primary) hypertension: Secondary | ICD-10-CM | POA: Insufficient documentation

## 2018-02-15 DIAGNOSIS — I82409 Acute embolism and thrombosis of unspecified deep veins of unspecified lower extremity: Secondary | ICD-10-CM | POA: Insufficient documentation

## 2020-01-19 IMAGING — CR DG OR LOCAL ABDOMEN
1 series · 1 of 1 positions shown · non-contrast
Comparison: Chest, abdomen and pelvis CT dated 07/19/2017.

CLINICAL DATA: Bladder repair surgery. No instrument count
performed.

EXAM:
OR LOCAL ABDOMEN

[AP]
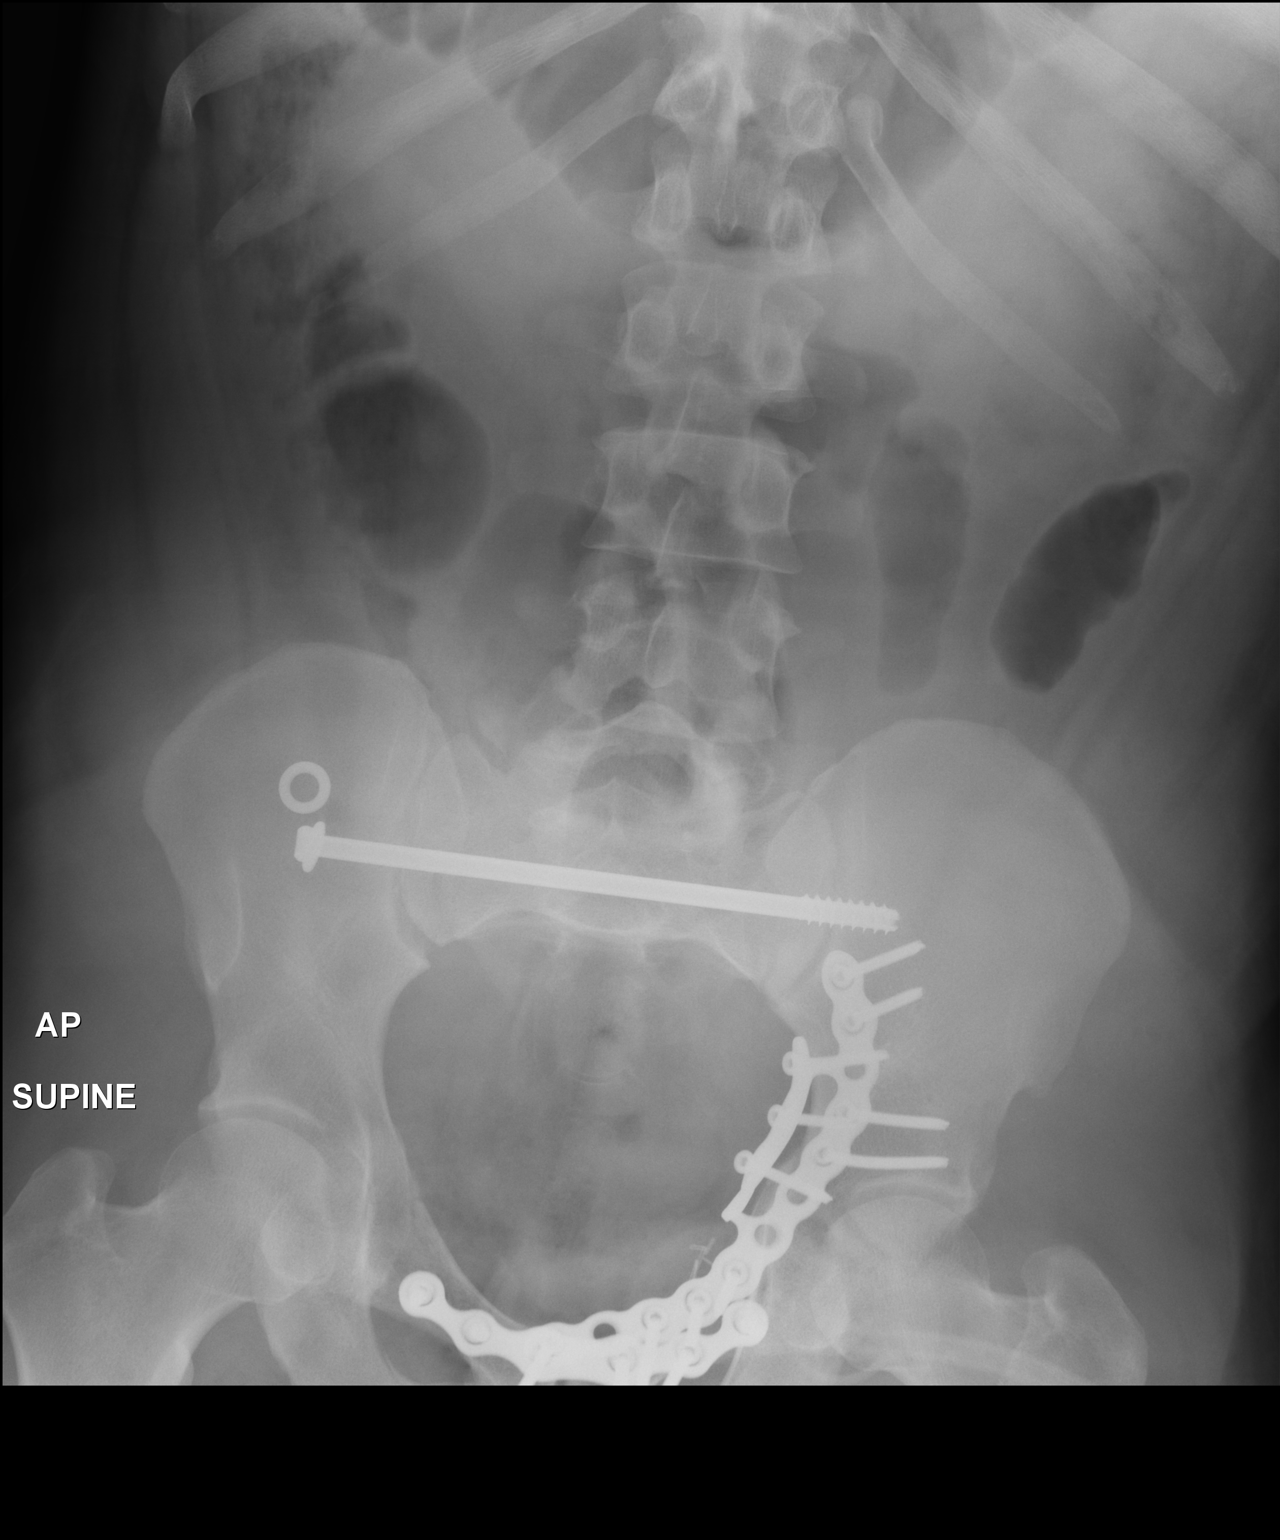

[1 of 1 positions shown; findings below may reference images not displayed]

FINDINGS: Interval pelvic fixation hardware. Less widening of the right
sacroiliac joint. Previously noted left acetabular fracture. There
is a rounded washer overlying the right iliac bone superior to the
sacroiliac fixation screw. There is a similar washer around the
proximal portion of the screw. Left pelvic surgical clips. Minimal
lumbar spine degenerative changes. Normal bowel gas pattern.
IMPRESSION: 1. Rounded washer overlying the right iliac bone superior to the
sacroiliac fixation screw. This is not attached to any hardware.
2. Postoperative changes, as described above.

Critical Value/emergent results were called by telephone at the time
of interpretation on 07/20/2017 at [DATE] to [employer] in the operating
room, who verbally acknowledged these results. He reported that this
is an expected finding based on the initial placement of the screw.

## 2020-01-21 IMAGING — DX DG CHEST 2V
2 series · 2 of 2 positions shown · non-contrast
Comparison: Chest radiograph 11/03/2013

CLINICAL DATA: Inpatient reports surgery pelvis on [DATE] and
endorses coughing earlier today but believes he had a clot in his
lungs and states that he did cough it up. Pt denies any chest
symptoms now. Only complaint per pt is pelvic pain.

EXAM:
CHEST - 2 VIEW

[chest lat]
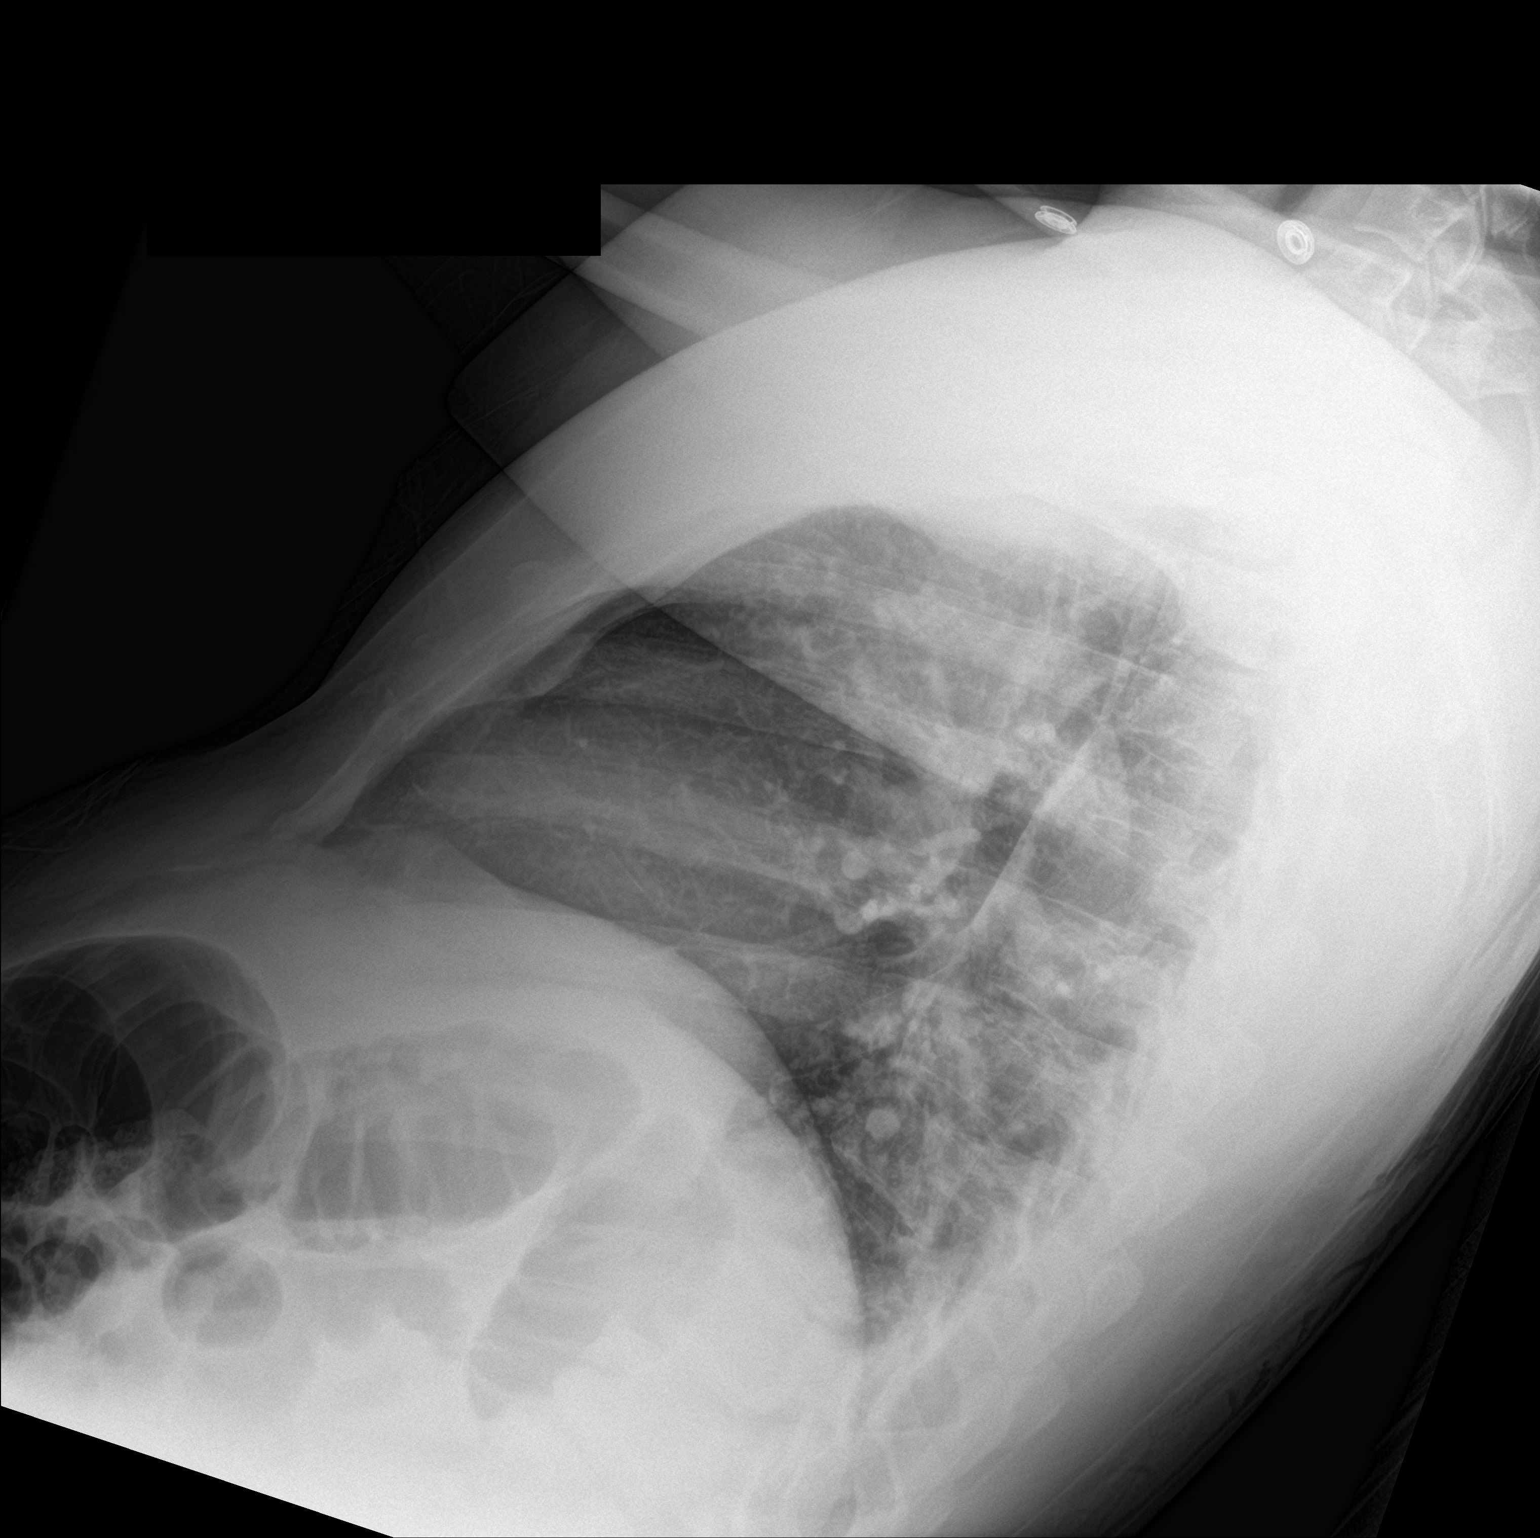

[chest ap]
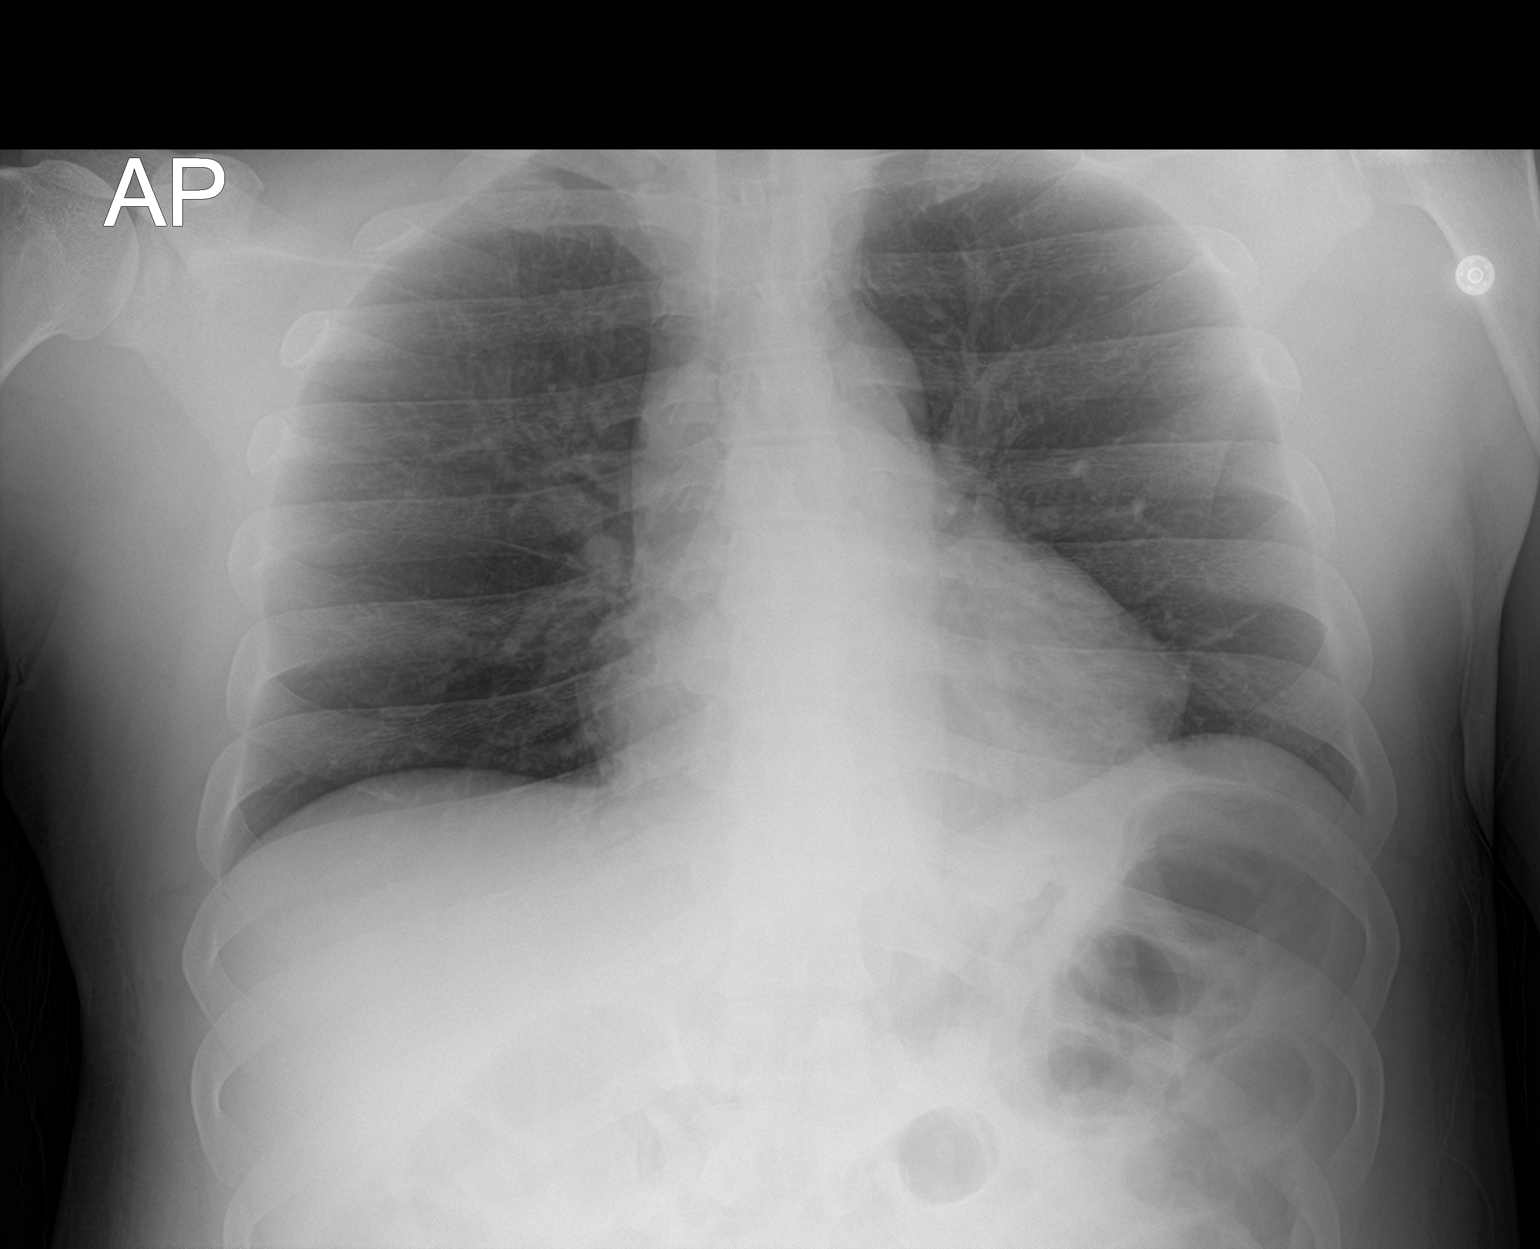

[2 of 2 positions shown; findings below may reference images not displayed]

FINDINGS: Normal mediastinum and cardiac silhouette. Normal pulmonary
vasculature. No evidence of effusion, infiltrate, or pneumothorax.
No acute bony abnormality.
IMPRESSION: No acute cardiopulmonary process.

## 2024-01-05 NOTE — Progress Notes (Signed)
 " Subjective   Patient ID:  Darrell Wright is a 48 y.o. (DOB 11/16/1975) male    Patient presents with   Shortness of Breath     History of Present Illness The patient is a 48 year old male who presents to the clinic for shortness of breath and cough.  He reports experiencing a persistent cough, which he describes as being in his throat. He also mentions an incident at work where he felt unable to breathe while handling some boxes, leading him to believe he may have inflammation in his lungs. His symptoms worsen when he lies down, making it difficult for him to breathe. He experiences shortness of breath during coughing episodes and has to bend over and take deep breaths to regain his normal breathing pattern. He continues to use his inhaler and reports no fevers, chills, or body aches. He has been consuming green tea and gargling with salt water for the past few days.  He has been off his blood pressure medications due to job loss and financial constraints. He was previously on Adderall, which initially helped but later caused drowsiness. He switched to carvedilol, which he took in the morning after work, but it resulted in vivid dreams. He also tried hydrochlorothiazide, which made him urinate frequently. He has reduced his salt and caffeine intake and has stopped drinking soda. He is considering resuming treadmill exercises.  He is interested in scheduling a colonoscopy.  Diet: Reduced salt and caffeine intake, stopped drinking soda Tobacco: Smokes half a pack of cigarettes daily Coffee/Tea/Caffeine-containing Drinks: Drinks green tea    Reviewed and updated this visit by provider: Tobacco  Allergies  Meds  Problems  Med Hx  Surg Hx  Fam Hx        Review of Systems  Constitutional:  Positive for chills. Negative for diaphoresis, fatigue and fever.  HENT:  Positive for congestion.   Respiratory:  Positive for cough, shortness of breath and wheezing.   Cardiovascular:  Negative for  chest pain and palpitations.  Gastrointestinal:  Negative for diarrhea, nausea and vomiting.  Musculoskeletal:  Negative for myalgias.  Neurological:  Negative for headaches.     Objective   Vitals:   01/05/24 0958 01/05/24 1000  BP: (!) 142/110 (!) 142/110  Patient Position: Sitting   Pulse: 69   Temp: 97.9 F (36.6 C)   TempSrc: Tympanic   Resp: 16   Height: 5' 5 (1.651 m)   Weight: 191 lb 12.8 oz (87 kg)   SpO2: 97%   BMI (Calculated): 31.9   PainSc: 0-No pain      Physical Exam Constitutional:      Appearance: Normal appearance.  HENT:     Nose: Congestion present.     Mouth/Throat:     Mouth: Mucous membranes are moist.     Pharynx: Oropharynx is clear. No oropharyngeal exudate or posterior oropharyngeal erythema.  Cardiovascular:     Rate and Rhythm: Normal rate and regular rhythm.     Heart sounds: Normal heart sounds. No murmur heard. Musculoskeletal:     Cervical back: Neck supple. No tenderness.  Pulmonary:     Effort: Pulmonary effort is normal. No respiratory distress.     Breath sounds: Rhonchi present. No wheezing or rales.  Lymphadenopathy:     Cervical: No cervical adenopathy.  Neurological:     Mental Status: He is alert.       Assessment and Plan  1. Chest congestion (Primary) -Given presentation and risk factors for bronchitis start with Z-Pak  prednisone taper and Promethazine  DM. -     azithromycin (ZITHROMAX Z-PAK) 250 mg tablet 2. Acute cough -See #1 stop warm beverages and Promethazine  DM for possible side effect. -     promethazine -dextromethorphan (PROMETHAZINE -DM) 6.25-15 MG/5ML syrup 3. Moderate asthma, unspecified whether complicated, unspecified whether persistent (*) -Mild rhonchi no wheezing normal oxygen saturation.  Continue with albuterol  and prednisone taper. -     predniSONE (DELTASONE) 20 mg tablet 4. Uncontrolled hypertension -Noncompliance with follow-up and likely medications to restart with medication to follow recheck  in 1 week continue low-salt low caffeine diet. -     amLODIPine  besylate (NORVASC ) 10 mg tablet -     hydroCHLOROthiazide 25 mg tablet -     losartan  potassium (COZAAR ) 50 mg tablet -     atenolol (TENORMIN) 50 mg tablet 5. Colon cancer screening -Due for colonoscopy, agreeable but will need blood pressure control prior.   Assessment & Plan 1. Shortness of breath and cough: - Reports significant congestion and difficulty breathing, particularly when lying down. Increased congestion is noted on the right side and some on the left. - Antibiotic and steroid prescribed to help reduce inflammation and mucus production. Cough syrup provided with instructions to avoid taking it during work hours or before driving due to its potential sedative effects. Advised to consume hot liquids such as soups, coffee, and tea to help clear the mucus from his throat.  2. Hypertension: - Has been off blood pressure medications, which increases the risk of heart attack and heart failure. - Discussion about restarting blood pressure medication. Atenolol 50 mg prescribed. - Advised to engage in physical activity for 30 to 40 minutes, three to four times a week.  3. Health maintenance: - Colonoscopy will be scheduled once blood pressure is under control.  Follow-up: The patient will follow up in 1 month.  Computer technology was used to create the visit note. Consent from the patient/caregiver was obtained prior to its use.      Carlin KATHEE Rice II, PA-C 01/05/2024, 10:24 AM  This note was dictated with voice recognition software. Similar sounding words or phrases can inadvertently be transcribed and may not be corrected upon review.   Follow up in about 4 weeks (around 02/02/2024) for bp recheck.      Patient's Medications  New Prescriptions   ATENOLOL (TENORMIN) 50 MG TABLET    Take one tablet (50 mg dose) by mouth daily.   AZITHROMYCIN (ZITHROMAX Z-PAK) 250 MG TABLET    Take two tablets (500 mg dose)  by mouth daily for 1 day, THEN one tablet (250 mg dose) daily for 4 days. Take 2 tablets (500 mg) on  Day 1,  followed by 1 tablet (250 mg) once daily on Days 2 through 5.   PREDNISONE (DELTASONE) 20 MG TABLET    Take two 20mg  tablets for three days, then switch to taking one 20mg  tablet for three days.   PROMETHAZINE -DEXTROMETHORPHAN (PROMETHAZINE -DM) 6.25-15 MG/5ML SYRUP    Take 5 mLs by mouth 4 (four) times a day as needed for up to 7 days.  Previous Medications   ALBUTEROL  SULFATE HFA (PROVENTIL ,VENTOLIN ,PROAIR ) 108 (90 BASE) MCG/ACT INHALER    Inhale two puffs into the lungs every 6 (six) hours as needed for Wheezing.   TIZANIDINE (ZANAFLEX) 4 MG TABLET    Take one tablet (4 mg dose) by mouth 3 (three) times a day.  Modified Medications   Modified Medication Previous Medication   AMLODIPINE  BESYLATE (NORVASC ) 10 MG TABLET amLODIPine   besylate (NORVASC ) 10 mg tablet      Take one tablet (10 mg dose) by mouth daily.    Take one tablet (10 mg dose) by mouth daily.   HYDROCHLOROTHIAZIDE 25 MG TABLET hydroCHLOROthiazide 25 mg tablet      Take one tablet (25 mg dose) by mouth daily.    Take one tablet (25 mg dose) by mouth daily. Take on tablet by mouth once a day in the morning.   LOSARTAN  POTASSIUM (COZAAR ) 50 MG TABLET losartan  potassium (COZAAR ) 50 mg tablet      Take one tablet (50 mg dose) by mouth daily.    Take one tablet (50 mg dose) by mouth daily.  Discontinued Medications   CARVEDILOL (COREG) 6.25 MG TABLET    Take one tablet (6.25 mg dose) by mouth 2 (two) times daily with meals. Replace atenolol      Risks, benefits, and alternatives of the medications and treatment plan prescribed today were discussed, and patient expressed understanding. Plan follow-up as discussed or as needed if any worsening symptoms or change in condition.   A yearly preventative health exam was recommended and current age based recommendations were discussed.  I have reviewed the information contained in this  note and personally verified its accuracy.  MDM billing - I personally developed the plan of care based on documented medical decision making. Carlin KATHEE Rice II, PA-C "

## 2024-04-17 ENCOUNTER — Other Ambulatory Visit: Payer: Self-pay

## 2024-04-17 ENCOUNTER — Emergency Department (HOSPITAL_COMMUNITY)

## 2024-04-17 ENCOUNTER — Encounter (HOSPITAL_COMMUNITY): Payer: Self-pay

## 2024-04-17 ENCOUNTER — Inpatient Hospital Stay (HOSPITAL_COMMUNITY)
Admission: EM | Admit: 2024-04-17 | Discharge: 2024-04-19 | DRG: 291 | Disposition: A | Discharge status: Home / Self Care | Attending: Student | Admitting: Student

## 2024-04-17 DIAGNOSIS — I509 Heart failure, unspecified: Secondary | ICD-10-CM | POA: Diagnosis present

## 2024-04-17 DIAGNOSIS — F1010 Alcohol abuse, uncomplicated: Secondary | ICD-10-CM | POA: Diagnosis present

## 2024-04-17 DIAGNOSIS — Z8419 Family history of other disorders of kidney and ureter: Secondary | ICD-10-CM

## 2024-04-17 DIAGNOSIS — Z6831 Body mass index (BMI) 31.0-31.9, adult: Secondary | ICD-10-CM | POA: Diagnosis not present

## 2024-04-17 DIAGNOSIS — N1831 Chronic kidney disease, stage 3a: Secondary | ICD-10-CM | POA: Diagnosis present

## 2024-04-17 DIAGNOSIS — Z7901 Long term (current) use of anticoagulants: Secondary | ICD-10-CM | POA: Diagnosis not present

## 2024-04-17 DIAGNOSIS — I2489 Other forms of acute ischemic heart disease: Secondary | ICD-10-CM | POA: Diagnosis present

## 2024-04-17 DIAGNOSIS — Z91148 Patient's other noncompliance with medication regimen for other reason: Secondary | ICD-10-CM

## 2024-04-17 DIAGNOSIS — E66811 Obesity, class 1: Secondary | ICD-10-CM | POA: Diagnosis present

## 2024-04-17 DIAGNOSIS — I130 Hypertensive heart and chronic kidney disease with heart failure and stage 1 through stage 4 chronic kidney disease, or unspecified chronic kidney disease: Secondary | ICD-10-CM | POA: Diagnosis present

## 2024-04-17 DIAGNOSIS — I5031 Acute diastolic (congestive) heart failure: Secondary | ICD-10-CM | POA: Diagnosis present

## 2024-04-17 DIAGNOSIS — Z1152 Encounter for screening for COVID-19: Secondary | ICD-10-CM

## 2024-04-17 DIAGNOSIS — Z79899 Other long term (current) drug therapy: Secondary | ICD-10-CM | POA: Diagnosis not present

## 2024-04-17 DIAGNOSIS — J4489 Other specified chronic obstructive pulmonary disease: Secondary | ICD-10-CM | POA: Diagnosis present

## 2024-04-17 DIAGNOSIS — I160 Hypertensive urgency: Secondary | ICD-10-CM | POA: Diagnosis present

## 2024-04-17 DIAGNOSIS — Z8249 Family history of ischemic heart disease and other diseases of the circulatory system: Secondary | ICD-10-CM

## 2024-04-17 DIAGNOSIS — I5043 Acute on chronic combined systolic (congestive) and diastolic (congestive) heart failure: Secondary | ICD-10-CM | POA: Insufficient documentation

## 2024-04-17 DIAGNOSIS — E876 Hypokalemia: Secondary | ICD-10-CM | POA: Diagnosis present

## 2024-04-17 DIAGNOSIS — T465X6A Underdosing of other antihypertensive drugs, initial encounter: Secondary | ICD-10-CM | POA: Diagnosis present

## 2024-04-17 DIAGNOSIS — I10 Essential (primary) hypertension: Secondary | ICD-10-CM

## 2024-04-17 DIAGNOSIS — I161 Hypertensive emergency: Secondary | ICD-10-CM | POA: Diagnosis present

## 2024-04-17 DIAGNOSIS — J439 Emphysema, unspecified: Secondary | ICD-10-CM | POA: Diagnosis present

## 2024-04-17 DIAGNOSIS — F1910 Other psychoactive substance abuse, uncomplicated: Secondary | ICD-10-CM | POA: Insufficient documentation

## 2024-04-17 DIAGNOSIS — Z91013 Allergy to seafood: Secondary | ICD-10-CM | POA: Diagnosis not present

## 2024-04-17 DIAGNOSIS — F1410 Cocaine abuse, uncomplicated: Secondary | ICD-10-CM | POA: Diagnosis present

## 2024-04-17 DIAGNOSIS — I5041 Acute combined systolic (congestive) and diastolic (congestive) heart failure: Secondary | ICD-10-CM | POA: Diagnosis not present

## 2024-04-17 DIAGNOSIS — N183 Chronic kidney disease, stage 3 unspecified: Secondary | ICD-10-CM | POA: Insufficient documentation

## 2024-04-17 DIAGNOSIS — F17210 Nicotine dependence, cigarettes, uncomplicated: Secondary | ICD-10-CM | POA: Diagnosis present

## 2024-04-17 DIAGNOSIS — R7989 Other specified abnormal findings of blood chemistry: Secondary | ICD-10-CM | POA: Insufficient documentation

## 2024-04-17 DIAGNOSIS — J449 Chronic obstructive pulmonary disease, unspecified: Secondary | ICD-10-CM

## 2024-04-17 LAB — CBC WITH DIFFERENTIAL/PLATELET
Abs Immature Granulocytes: 0.03 K/uL (ref 0.00–0.07)
Basophils Absolute: 0 K/uL (ref 0.0–0.1)
Basophils Relative: 0 %
Eosinophils Absolute: 0.1 K/uL (ref 0.0–0.5)
Eosinophils Relative: 1 %
HCT: 39.7 % (ref 39.0–52.0)
Hemoglobin: 13.3 g/dL (ref 13.0–17.0)
Immature Granulocytes: 0 %
Lymphocytes Relative: 5 %
Lymphs Abs: 0.4 K/uL — ABNORMAL LOW (ref 0.7–4.0)
MCH: 30.1 pg (ref 26.0–34.0)
MCHC: 33.5 g/dL (ref 30.0–36.0)
MCV: 89.8 fL (ref 80.0–100.0)
Monocytes Absolute: 0.1 K/uL (ref 0.1–1.0)
Monocytes Relative: 1 %
Neutro Abs: 7.6 K/uL (ref 1.7–7.7)
Neutrophils Relative %: 93 %
Platelets: 382 K/uL (ref 150–400)
RBC: 4.42 MIL/uL (ref 4.22–5.81)
RDW: 14.6 % (ref 11.5–15.5)
WBC: 8.3 K/uL (ref 4.0–10.5)
nRBC: 0 % (ref 0.0–0.2)

## 2024-04-17 LAB — BLOOD GAS, VENOUS
Acid-Base Excess: 3.3 mmol/L — ABNORMAL HIGH (ref 0.0–2.0)
Bicarbonate: 27.8 mmol/L (ref 20.0–28.0)
O2 Saturation: 99.9 %
Patient temperature: 37
pCO2, Ven: 41 mmHg — ABNORMAL LOW (ref 44–60)
pH, Ven: 7.44 — ABNORMAL HIGH (ref 7.25–7.43)
pO2, Ven: 120 mmHg — ABNORMAL HIGH (ref 32–45)

## 2024-04-17 LAB — RESP PANEL BY RT-PCR (RSV, FLU A&B, COVID)  RVPGX2
Influenza A by PCR: NEGATIVE
Influenza B by PCR: NEGATIVE
Resp Syncytial Virus by PCR: NEGATIVE
SARS Coronavirus 2 by RT PCR: NEGATIVE

## 2024-04-17 LAB — BASIC METABOLIC PANEL WITH GFR
Anion gap: 10 (ref 5–15)
BUN: 19 mg/dL (ref 6–20)
CO2: 27 mmol/L (ref 22–32)
Calcium: 8.8 mg/dL — ABNORMAL LOW (ref 8.9–10.3)
Chloride: 104 mmol/L (ref 98–111)
Creatinine, Ser: 1.63 mg/dL — ABNORMAL HIGH (ref 0.61–1.24)
GFR, Estimated: 51 mL/min — ABNORMAL LOW
Glucose, Bld: 97 mg/dL (ref 70–99)
Potassium: 3.1 mmol/L — ABNORMAL LOW (ref 3.5–5.1)
Sodium: 141 mmol/L (ref 135–145)

## 2024-04-17 LAB — PROTIME-INR
INR: 1 (ref 0.8–1.2)
Prothrombin Time: 13.4 s (ref 11.4–15.2)

## 2024-04-17 LAB — TROPONIN T, HIGH SENSITIVITY
Troponin T High Sensitivity: 71 ng/L — ABNORMAL HIGH (ref 0–19)
Troponin T High Sensitivity: 63 ng/L — ABNORMAL HIGH (ref 0–19)

## 2024-04-17 LAB — PRO BRAIN NATRIURETIC PEPTIDE: Pro Brain Natriuretic Peptide: 6006 pg/mL — ABNORMAL HIGH

## 2024-04-17 MED ORDER — MAGNESIUM SULFATE 2 GM/50ML IV SOLN
2.0000 g | Freq: Once | INTRAVENOUS | Status: AC
  Administered 2024-04-17: 2 g via INTRAVENOUS
  Filled 2024-04-17: qty 50

## 2024-04-17 MED ORDER — NITROGLYCERIN IN D5W 200-5 MCG/ML-% IV SOLN
0.0000 ug/min | INTRAVENOUS | Status: DC
  Administered 2024-04-17: 5 ug/min via INTRAVENOUS
  Filled 2024-04-17: qty 250

## 2024-04-17 MED ORDER — LOSARTAN POTASSIUM 50 MG PO TABS
50.0000 mg | ORAL_TABLET | Freq: Every day | ORAL | Status: DC
  Administered 2024-04-18: 50 mg via ORAL
  Filled 2024-04-17: qty 1

## 2024-04-17 MED ORDER — ALBUTEROL SULFATE (2.5 MG/3ML) 0.083% IN NEBU
10.0000 mg | INHALATION_SOLUTION | Freq: Once | RESPIRATORY_TRACT | Status: AC
  Administered 2024-04-17: 10 mg via RESPIRATORY_TRACT
  Filled 2024-04-17: qty 12

## 2024-04-17 MED ORDER — IOHEXOL 350 MG/ML SOLN
75.0000 mL | Freq: Once | INTRAVENOUS | Status: AC | PRN
  Administered 2024-04-17: 75 mL via INTRAVENOUS

## 2024-04-17 MED ORDER — AMLODIPINE BESYLATE 5 MG PO TABS
10.0000 mg | ORAL_TABLET | Freq: Every day | ORAL | Status: DC
  Administered 2024-04-18 – 2024-04-19 (×3): 10 mg via ORAL
  Filled 2024-04-17 (×2): qty 2
  Filled 2024-04-17: qty 1

## 2024-04-17 MED ORDER — METHYLPREDNISOLONE SODIUM SUCC 125 MG IJ SOLR
125.0000 mg | Freq: Once | INTRAMUSCULAR | Status: AC
  Administered 2024-04-17: 125 mg via INTRAVENOUS
  Filled 2024-04-17: qty 2

## 2024-04-17 MED ORDER — POTASSIUM CHLORIDE CRYS ER 20 MEQ PO TBCR
40.0000 meq | EXTENDED_RELEASE_TABLET | Freq: Once | ORAL | Status: AC
  Administered 2024-04-17: 40 meq via ORAL
  Filled 2024-04-17: qty 2

## 2024-04-17 MED ORDER — POTASSIUM CHLORIDE 20 MEQ PO PACK: 20.0000 meq | PACK | Freq: Once | ORAL | Status: DC

## 2024-04-17 MED ORDER — FUROSEMIDE 10 MG/ML IJ SOLN
40.0000 mg | Freq: Once | INTRAMUSCULAR | Status: AC
  Administered 2024-04-17: 40 mg via INTRAVENOUS
  Filled 2024-04-17: qty 4

## 2024-04-17 MED ORDER — IPRATROPIUM-ALBUTEROL 0.5-2.5 (3) MG/3ML IN SOLN
3.0000 mL | Freq: Once | RESPIRATORY_TRACT | Status: AC
  Administered 2024-04-17: 3 mL via RESPIRATORY_TRACT
  Filled 2024-04-17: qty 3

## 2024-04-17 NOTE — ED Triage Notes (Addendum)
 Patient reports cough/SOB since last week, more SOB with exertion. Denies fevers. Patient is alert and oriented x 4. Airway patent, respirations even and unlabored. Skin normal, warm and dry. HX of asthma, patient diaphoretic, pallor.

## 2024-04-17 NOTE — ED Provider Notes (Signed)
 " [facility] EMERGENCY DEPARTMENT AT [facility] Provider Note   CSN: [id number] Arrival date & time: 04/17/24  1827     Patient presents with: Cough and Shortness of Breath   Darrell Wright is a 49 y.o. male.  {Add pertinent medical, surgical, social history, OB history to HPI:32947}  Cough Associated symptoms: shortness of breath   Shortness of Breath Associated symptoms: cough      49 year old male with medical history significant for EtOH use, asthma presents to the emergency department with shortness of breath.  Patient was seen in triage and was bedded emergently due to respiratory distress.  Patient was tripoding, diaphoretic and acutely short of breath in triage.  He states that his symptoms have been worsening over the past few days.  He denies any chest pain or back pain.  No known history of CHF.  Patient denies any recent cocaine use.  Prior to Admission medications  Medication Sig Start Date End Date Taking? Authorizing Provider  folic acid  (FOLVITE ) 1 MG tablet Take 1 tablet (1 mg total) by mouth daily. 08/04/17   Angiulli, Toribio PARAS, PA-C  gabapentin  (NEURONTIN ) 300 MG capsule Take 1 capsule (300 mg total) by mouth 2 (two) times daily. 11/16/17   Debby Fidela CROME, NP  lisinopril  (PRINIVIL ,ZESTRIL ) 10 MG tablet TAKE 1 TABLET BY MOUTH EVERY DAY 12/10/17   Debby Fidela CROME, NP  methocarbamol  (ROBAXIN ) 750 MG tablet Take 1 tablet (750 mg total) by mouth 4 (four) times daily. 09/15/17   Babs Arthea DASEN, MD  mirabegron  ER (MYRBETRIQ ) 25 MG TB24 tablet Take 1 tablet (25 mg total) by mouth daily. 08/04/17   Angiulli, Daniel J, PA-C  Multiple Vitamin (MULTIVITAMIN WITH MINERALS) TABS tablet Take 1 tablet by mouth daily. 08/04/17   Angiulli, Toribio PARAS, PA-C  oxybutynin  (DITROPAN ) 5 MG tablet Take 1 tablet (5 mg total) by mouth 3 (three) times daily. 08/04/17   Angiulli, Toribio PARAS, PA-C  Oxycodone  HCl 10 MG TABS Take 1 tablet (10 mg total) by mouth every 8 (eight) hours as needed.  12/15/17   Debby Fidela CROME, NP  polyethylene glycol (MIRALAX  / GLYCOLAX ) packet Take 17 g by mouth 2 (two) times daily. 08/04/17   Angiulli, Toribio PARAS, PA-C  traMADol  (ULTRAM ) 50 MG tablet Take 1 tablet (50 mg total) by mouth every 8 (eight) hours as needed. 12/15/17   Debby Fidela CROME, NP  warfarin (COUMADIN ) 5 MG tablet Take two Tablets in the evening. Use as directed according to results. 08/14/17   Debby Fidela CROME, NP    Allergies: Fish allergy, Other, and Shellfish allergy    Review of Systems  Respiratory:  Positive for cough and shortness of breath.   All other systems reviewed and are negative.   Updated Vital Signs BP (!) 204/173   Pulse 95   Temp 98.2 F (36.8 C)   Resp (!) 23   SpO2 96%   Physical Exam Vitals and nursing note reviewed.  Constitutional:      General: He is in acute distress.     Appearance: He is well-developed. He is diaphoretic.  HENT:     Head: Normocephalic and atraumatic.  Eyes:     Conjunctiva/sclera: Conjunctivae normal.  Cardiovascular:     Rate and Rhythm: Normal rate and regular rhythm.     Heart sounds: No murmur heard. Pulmonary:     Effort: Tachypnea and respiratory distress present.     Breath sounds: Examination of the right-upper field reveals wheezing. Examination  of the left-upper field reveals wheezing. Wheezing present.     Comments: Patient with labored breathing, tripoding, tachypneic, expiratory wheezing present in the upper lung fields Abdominal:     Palpations: Abdomen is soft.     Tenderness: There is no abdominal tenderness.  Musculoskeletal:        General: No swelling.     Cervical back: Neck supple.     Right lower leg: Edema present.     Left lower leg: Edema present.  Skin:    General: Skin is warm.     Capillary Refill: Capillary refill takes less than 2 seconds.  Neurological:     Mental Status: He is alert.  Psychiatric:        Mood and Affect: Mood normal.     (all labs ordered are listed, but only  abnormal results are displayed) Labs Reviewed  BASIC METABOLIC PANEL WITH GFR - Abnormal; Notable for the following components:      Result Value   Potassium 3.1 (*)    Creatinine, Ser 1.63 (*)    Calcium 8.8 (*)    GFR, Estimated 51 (*)    All other components within normal limits  PRO BRAIN NATRIURETIC PEPTIDE - Abnormal; Notable for the following components:   Pro Brain Natriuretic Peptide 6,006.0 (*)    All other components within normal limits  BLOOD GAS, VENOUS - Abnormal; Notable for the following components:   pH, Ven 7.44 (*)    pCO2, Ven 41 (*)    pO2, Ven 120 (*)    Acid-Base Excess 3.3 (*)    All other components within normal limits  TROPONIN T, HIGH SENSITIVITY - Abnormal; Notable for the following components:   Troponin T High Sensitivity 71 (*)    All other components within normal limits  RESP PANEL BY RT-PCR (RSV, FLU A&B, COVID)  [id number]  TROPONIN T, HIGH SENSITIVITY    EKG: EKG Interpretation Date/Time:  Sunday April 17 2024 19:12:39 EST Ventricular Rate:  89 PR Interval:  133 QRS Duration:  93 QT Interval:  375 QTC Calculation: 457 R Axis:   80  Text Interpretation: Sinus rhythm Probable left atrial enlargement Nonspecific T abnormalities, lateral leads Confirmed by Jerrol Agent ([id number]) on 04/17/2024 7:27:04 PM  Radiology: CT Angio Chest PE W and/or Wo Contrast Result Date: 04/17/2024 EXAM: CTA of the Chest with contrast for PE 04/17/2024 09:04:16 PM TECHNIQUE: CTA of the chest was performed after the administration of 75 mL of iohexol  (OMNIPAQUE ) 350 MG/ML injection. Multiplanar reformatted images are provided for review. MIP images are provided for review. Automated exposure control, iterative reconstruction, and/or weight based adjustment of the mA/kV was utilized to reduce the radiation dose to as low as reasonably achievable. COMPARISON: CT chest 07/19/2017 CLINICAL HISTORY: Pulmonary embolism (PE) suspected, high prob. SOB with exertion FINDINGS:  PULMONARY ARTERIES: Pulmonary arteries are adequately opacified for evaluation. No pulmonary embolism. Main pulmonary artery is normal in caliber. MEDIASTINUM: Mild cardiomegaly. Mild coronary atherosclerosis of the LAD. There is no acute abnormality of the thoracic aorta. LYMPH NODES: No mediastinal, hilar or axillary lymphadenopathy. LUNGS AND PLEURA: Mild paraseptal emphysematous changes. Small bilateral pleural effusions, right greater than left. No frank interstitial edema. No pneumothorax. UPPER ABDOMEN: Limited images of the upper abdomen are unremarkable. SOFT TISSUES AND BONES: No acute bone or soft tissue abnormality. IMPRESSION: 1. No pulmonary embolism. 2. Small bilateral pleural effusions, right greater than left. 3. No frank interstitial edema. 4. Mild paraseptal emphysema; pulmonary emphysema is an independent risk  factor for lung cancer and consideration is recommended for evaluation for a low-dose CT lung cancer screening program. 5. Mild cardiomegaly. Electronically signed by: Pinkie Pebbles MD MD 04/17/2024 09:12 PM EST RP Workstation: [id number]   DG Chest 2 View Result Date: 04/17/2024 CLINICAL DATA:  Dyspnea on exertion. EXAM: CHEST - 2 VIEW COMPARISON:  July 22, 2017 FINDINGS: The cardiac silhouette is mildly enlarged. There is mild prominence of the central pulmonary vasculature with mild bilateral suprahilar peribronchial cuffing. No focal consolidation, pleural effusion or pneumothorax is identified. The visualized skeletal structures are unremarkable. IMPRESSION: Mild cardiomegaly with mild central pulmonary vascular congestion. Electronically Signed   By: Suzen Dials M.D.   On: 04/17/2024 19:04    {Document cardiac monitor, telemetry assessment procedure when appropriate:32947} .Critical Care  Performed by: Jerrol Agent, MD Authorized by: Jerrol Agent, MD   Critical care provider statement:    Critical care time (minutes):  30   Critical care was necessary to  treat or prevent imminent or life-threatening deterioration of the following conditions:  Respiratory failure   Critical care was time spent personally by me on the following activities:  Development of treatment plan with patient or surrogate, discussions with consultants, evaluation of patient's response to treatment, examination of patient, ordering and review of laboratory studies, ordering and review of radiographic studies, ordering and performing treatments and interventions, pulse oximetry, re-evaluation of patient's condition and review of old charts   Care discussed with: admitting provider      Medications Ordered in the ED  nitroGLYCERIN  50 mg in dextrose  5 % 250 mL (0.2 mg/mL) infusion (15 mcg/min Intravenous Rate/Dose Change 04/17/24 2135)  ipratropium-albuterol  (DUONEB) 0.5-2.5 (3) MG/3ML nebulizer solution 3 mL (3 mLs Nebulization Given 04/17/24 1906)  methylPREDNISolone  sodium succinate (SOLU-MEDROL ) 125 mg/2 mL injection 125 mg (125 mg Intravenous Given 04/17/24 1928)  magnesium  sulfate IVPB 2 g 50 mL (0 g Intravenous Stopped 04/17/24 2036)  albuterol  (PROVENTIL ) (2.5 MG/3ML) 0.083% nebulizer solution 10 mg (10 mg Nebulization Given 04/17/24 1924)  furosemide  (LASIX ) injection 40 mg (40 mg Intravenous Given 04/17/24 2114)  potassium chloride  SA (KLOR-CON  M) CR tablet 40 mEq (40 mEq Oral Given 04/17/24 2111)  iohexol  (OMNIPAQUE ) 350 MG/ML injection 75 mL (75 mLs Intravenous Contrast Given 04/17/24 2101)    Clinical Course as of 04/17/24 2136  Sun Apr 17, 2024  2047 Pro Brain Natriuretic Peptide(!): 6,006.0 [JL]  2135 Troponin T High Sensitivity(!): 71 [JL]    Clinical Course User Index [JL] Jerrol Agent, MD   {Click here for ABCD2, HEART and other calculators REFRESH Note before signing:1}                              Medical Decision Making Amount and/or Complexity of Data Reviewed Labs: ordered. Decision-making details documented in ED Course. Radiology:  ordered.  Risk Prescription drug management. Decision regarding hospitalization.    49 year old male with medical history significant for EtOH use, asthma presents to the emergency department with shortness of breath.  Patient was seen in triage and was bedded emergently due to respiratory distress.  Patient was tripoding, diaphoretic and acutely short of breath in triage.  He states that his symptoms have been worsening over the past few days.  He denies any chest pain or back pain.  No known history of CHF.  Patient denies any recent cocaine use.  ***  {Document critical care time when appropriate  Document review of labs and clinical  decision tools ie CHADS2VASC2, etc  Document your independent review of radiology images and any outside records  Document your discussion with family members, caretakers and with consultants  Document social determinants of health affecting pt's care  Document your decision making why or why not admission, treatments were needed:32947:::1}   Final diagnoses:  Acute congestive heart failure, unspecified heart failure type [facility])    ED Discharge Orders     None        "

## 2024-04-17 NOTE — ED Notes (Signed)
"  Respiratory called  "

## 2024-04-17 NOTE — ED Provider Triage Note (Signed)
 Emergency Medicine Provider Triage Evaluation Note  LANDO ALCALDE , a 49 y.o. male  was evaluated in triage.  Pt complains of shob, DOE. +Orthopnea Cough for past week. No fevers, chills  Review of Systems  Positive: See hpi Negative:   Physical Exam  BP (!) 213/149 (BP Location: Left Arm)   Pulse 93   Temp 98.2 F (36.8 C)   Resp (!) 22   SpO2 95%  Gen:   Awake, no distress   Resp:  Normal effort. No wheezing. Decreased lung sounds. tachypnea MSK:   Moves extremities without difficulty  Other:  Diaphoretic. 4+ pitting edema bilaterally  Medical Decision Making  Medically screening exam initiated at 6:46 PM.  Appropriate orders placed.  JUJUAN DUGO was informed that the remainder of the evaluation will be completed by another provider, this initial triage assessment does not replace that evaluation, and the importance of remaining in the ED until their evaluation is complete.  Labs and CXR ordered Cannot return to waiting room. Nursing informed   Minnie Tinnie BRAVO, [place] 04/17/24 1850

## 2024-04-17 NOTE — ED Notes (Signed)
 Labs and blood gas redrawn/sent to lab

## 2024-04-17 NOTE — H&P (Signed)
 " History and Physical    DARNELL JESCHKE FMW:[id number] DOB: 09/27/1975 DOA: 04/17/2024  Patient coming from: Home.  Chief Complaint: Shortness of breath.  HPI: SAYLOR SHECKLER is a 49 y.o. male with history of hypertension, asthma, polysubstance abuse, chronic kidney disease stage III presents to the ER with complaints of shortness of breath.  Patient states over the last 1 week patient has been experiencing exertional shortness of breath.  Patient also noticed at times lower extremity edema.  Denies chest pain or fever chills.  Has been having some orthopnea and paroxysmal nocturnal dyspnea.  Has not taken his antihypertensives last few days.  ED Course: In the ER patient's systolic blood pressure was more than 200 with diastolic more than 130.  CT angiogram of the chest was negative for PE but did show bilateral pleural effusion.  Patient was started on nitroglycerin  infusion for hypertensive urgency Lasix  40 mg IV was given for acute CHF and also dose of steroid was given.  Troponins remain flat at 71 and 63.  proBNP was 6000.  Potassium was 3.1 potassium replacement was ordered.  Creatinine 1.6.  Review of Systems: As per HPI, rest all negative.   Past Medical History:  Diagnosis Date   Abrasions of multiple sites 11/03/2013   knees   Asthma    Laceration of forearm, left 11/03/2013   sutured   Metacarpal bone fracture 11/03/2013   left small    Past Surgical History:  Procedure Laterality Date   BLADDER REPAIR N/A 07/20/2017   Procedure: REPAIR OF BLADDER RUPTURE;  Surgeon: Watt Rush, MD;  Location: [facility] OR;  Service: Urology;  Laterality: N/A;   FACIAL LACERATION REPAIR N/A 07/19/2017   Procedure: FACIAL LACERATION REPAIR;  Surgeon: Mable Lenis, MD;  Location: [facility] OR;  Service: ENT;  Laterality: N/A;   INSERTION OF TRACTION PIN Left 07/19/2017   Procedure: INSERTION OF TRACTION PIN;  Surgeon: Cristy Bonner DASEN, MD;  Location: [facility] OR;  Service: Orthopedics;  Laterality: Left;   NO PAST  SURGERIES     OPEN REDUCTION INTERNAL FIXATION (ORIF) METACARPAL Left 11/09/2013   Procedure: OPEN REDUCTION INTERNAL FIXATION (ORIF) OF LEFT SMALL FINGER METACARPAL FRACTURE    ;  Surgeon: Donnice Robinsons, MD;  Location: [facility];  Service: Orthopedics;  Laterality: Left;   ORIF ACETABULAR FRACTURE Left 07/20/2017   Procedure: OPEN REDUCTION INTERNAL FIXATION (ORIF) ACETABULAR FRACTURE;  Surgeon: Celena Sharper, MD;  Location: [facility] OR;  Service: Orthopedics;  Laterality: Left;   ORIF PELVIC FRACTURE Right 07/20/2017   Procedure: OPEN REDUCTION INTERNAL FIXATION (ORIF) PELVIC FRACTURE;  Surgeon: Celena Sharper, MD;  Location: [facility] OR;  Service: Orthopedics;  Laterality: Right;     reports that he has quit smoking. His smoking use included cigarettes. He has never used smokeless tobacco. He reports current alcohol use. He reports that he does not use drugs.  Allergies[1]  No family history on file.  Prior to Admission medications  Medication Sig Start Date End Date Taking? Authorizing Provider  folic acid  (FOLVITE ) 1 MG tablet Take 1 tablet (1 mg total) by mouth daily. 08/04/17   Angiulli, Toribio PARAS, PA-C  gabapentin  (NEURONTIN ) 300 MG capsule Take 1 capsule (300 mg total) by mouth 2 (two) times daily. 11/16/17   Debby Fidela CROME, NP  lisinopril  (PRINIVIL ,ZESTRIL ) 10 MG tablet TAKE 1 TABLET BY MOUTH EVERY DAY 12/10/17   Debby Fidela CROME, NP  methocarbamol  (ROBAXIN ) 750 MG tablet Take 1 tablet (750 mg total) by mouth 4 (four)  times daily. 09/15/17   Babs Arthea DASEN, MD  mirabegron  ER (MYRBETRIQ ) 25 MG TB24 tablet Take 1 tablet (25 mg total) by mouth daily. 08/04/17   Angiulli, Daniel J, PA-C  Multiple Vitamin (MULTIVITAMIN WITH MINERALS) TABS tablet Take 1 tablet by mouth daily. 08/04/17   Angiulli, Toribio PARAS, PA-C  oxybutynin  (DITROPAN ) 5 MG tablet Take 1 tablet (5 mg total) by mouth 3 (three) times daily. 08/04/17   Angiulli, Toribio PARAS, PA-C  Oxycodone  HCl 10 MG TABS Take 1 tablet (10 mg  total) by mouth every 8 (eight) hours as needed. 12/15/17   Debby Fidela CROME, NP  polyethylene glycol (MIRALAX  / GLYCOLAX ) packet Take 17 g by mouth 2 (two) times daily. 08/04/17   Angiulli, Toribio PARAS, PA-C  traMADol  (ULTRAM ) 50 MG tablet Take 1 tablet (50 mg total) by mouth every 8 (eight) hours as needed. 12/15/17   Debby Fidela CROME, NP  warfarin (COUMADIN ) 5 MG tablet Take two Tablets in the evening. Use as directed according to results. 08/14/17   Debby Fidela CROME, NP    Physical Exam: Constitutional: Rightly built and nourished. Vitals:   04/17/24 2045 04/17/24 2154 04/17/24 2200 04/17/24 2215  BP: (!) 204/173 (!) 192/130 (!) 156/131 (!) 174/114  Pulse: 95 90 81 83  Resp: (!) 23 (!) 22 16 18   Temp:      SpO2: 96% 95% 96% 95%   Eyes: Anicteric no pallor. ENMT: No discharge from the ears eyes nose or mouth. Neck: No mass felt.  No neck rigidity. Respiratory: No rhonchi or crepitations. Cardiovascular: S1-S2 heard. Abdomen: Soft nontender bowel sound present. Musculoskeletal: No edema. Skin: No rash. Neurologic: Alert awake oriented to time place and person.  Moves all extremities. Psychiatric: Appears normal.  Normal affect.   Labs on Admission: I have personally reviewed following labs and imaging studies  CBC: Recent Labs  Lab 04/17/24 2218  WBC 8.3  NEUTROABS 7.6  HGB 13.3  HCT 39.7  MCV 89.8  PLT 382   Basic Metabolic Panel: Recent Labs  Lab 04/17/24 1953  NA 141  K 3.1*  CL 104  CO2 27  GLUCOSE 97  BUN 19  CREATININE 1.63*  CALCIUM 8.8*   GFR: CrCl cannot be calculated (Unknown ideal weight.). Liver Function Tests: No results for input(s): AST, ALT, ALKPHOS, BILITOT, PROT, ALBUMIN  in the last 168 hours. No results for input(s): LIPASE, AMYLASE in the last 168 hours. No results for input(s): AMMONIA in the last 168 hours. Coagulation Profile: Recent Labs  Lab 04/17/24 2218  INR 1.0   Cardiac Enzymes: No results for input(s):  CKTOTAL, CKMB, CKMBINDEX, TROPONINI in the last 168 hours. BNP (last 3 results) Recent Labs    04/17/24 1953  PROBNP 6,006.0*   HbA1C: No results for input(s): HGBA1C in the last 72 hours. CBG: No results for input(s): GLUCAP in the last 168 hours. Lipid Profile: No results for input(s): CHOL, HDL, LDLCALC, TRIG, CHOLHDL, LDLDIRECT in the last 72 hours. Thyroid Function Tests: No results for input(s): TSH, T4TOTAL, FREET4, T3FREE, THYROIDAB in the last 72 hours. Anemia Panel: No results for input(s): VITAMINB12, FOLATE, FERRITIN, TIBC, IRON, RETICCTPCT in the last 72 hours. Urine analysis:    Component Value Date/Time   COLORURINE YELLOW 07/22/2017 2000   APPEARANCEUR HAZY (A) 07/22/2017 2000   LABSPEC 1.013 07/22/2017 2000   PHURINE 9.0 (H) 07/22/2017 2000   GLUCOSEU NEGATIVE 07/22/2017 2000   HGBUR LARGE (A) 07/22/2017 2000   BILIRUBINUR NEGATIVE 07/22/2017 2000   THEADORE  NEGATIVE 07/22/2017 2000   PROTEINUR 30 (A) 07/22/2017 2000   NITRITE NEGATIVE 07/22/2017 2000   LEUKOCYTESUR SMALL (A) 07/22/2017 2000   Sepsis Labs: @LABRCNTIP (procalcitonin:4,lacticidven:4) ) Recent Results (from the past 240 hours)  Resp panel by RT-PCR (RSV, Flu A&B, Covid) Anterior Nasal Swab     Status: None   Collection Time: 04/17/24  7:52 PM   Specimen: Anterior Nasal Swab  Result Value Ref Range Status   SARS Coronavirus 2 by RT PCR NEGATIVE NEGATIVE Final    Comment: (NOTE) SARS-CoV-2 target nucleic acids are NOT DETECTED.  The SARS-CoV-2 RNA is generally detectable in upper respiratory specimens during the acute phase of infection. The lowest concentration of SARS-CoV-2 viral copies this assay can detect is 138 copies/mL. A negative result does not preclude SARS-Cov-2 infection and should not be used as the sole basis for treatment or other patient management decisions. A negative result may occur with  improper specimen  collection/handling, submission of specimen other than nasopharyngeal swab, presence of viral mutation(s) within the areas targeted by this assay, and inadequate number of viral copies(<138 copies/mL). A negative result must be combined with clinical observations, patient history, and epidemiological information. The expected result is Negative.  Fact Sheet for Patients:  bloggercourse.com  Fact Sheet for Healthcare Providers:  seriousbroker.it  This test is no t yet approved or cleared by the [place]  FDA and  has been authorized for detection and/or diagnosis of SARS-CoV-2 by FDA under an Emergency Use Authorization (EUA). This EUA will remain  in effect (meaning this test can be used) for the duration of the COVID-19 declaration under Section 564(b)(1) of the Act, 21 U.S.C.section 360bbb-3(b)(1), unless the authorization is terminated  or revoked sooner.       Influenza A by PCR NEGATIVE NEGATIVE Final   Influenza B by PCR NEGATIVE NEGATIVE Final    Comment: (NOTE) The Xpert Xpress SARS-CoV-2/FLU/RSV plus assay is intended as an aid in the diagnosis of influenza from Nasopharyngeal swab specimens and should not be used as a sole basis for treatment. Nasal washings and aspirates are unacceptable for Xpert Xpress SARS-CoV-2/FLU/RSV testing.  Fact Sheet for Patients: bloggercourse.com  Fact Sheet for Healthcare Providers: seriousbroker.it  This test is not yet approved or cleared by the [place]  FDA and has been authorized for detection and/or diagnosis of SARS-CoV-2 by FDA under an Emergency Use Authorization (EUA). This EUA will remain in effect (meaning this test can be used) for the duration of the COVID-19 declaration under Section 564(b)(1) of the Act, 21 U.S.C. section 360bbb-3(b)(1), unless the authorization is terminated or revoked.     Resp Syncytial  Virus by PCR NEGATIVE NEGATIVE Final    Comment: (NOTE) Fact Sheet for Patients: bloggercourse.com  Fact Sheet for Healthcare Providers: seriousbroker.it  This test is not yet approved or cleared by the [place]  FDA and has been authorized for detection and/or diagnosis of SARS-CoV-2 by FDA under an Emergency Use Authorization (EUA). This EUA will remain in effect (meaning this test can be used) for the duration of the COVID-19 declaration under Section 564(b)(1) of the Act, 21 U.S.C. section 360bbb-3(b)(1), unless the authorization is terminated or revoked.  Performed at [facility], [address], [place], [place] [address]      Radiological Exams on Admission: CT Angio Chest PE W and/or Wo Contrast Result Date: 04/17/2024 EXAM: CTA of the Chest with contrast for PE 04/17/2024 09:04:16 PM TECHNIQUE: CTA of the chest was performed after the administration of 75  mL of iohexol  (OMNIPAQUE ) 350 MG/ML injection. Multiplanar reformatted images are provided for review. MIP images are provided for review. Automated exposure control, iterative reconstruction, and/or weight based adjustment of the mA/kV was utilized to reduce the radiation dose to as low as reasonably achievable. COMPARISON: CT chest 07/19/2017 CLINICAL HISTORY: Pulmonary embolism (PE) suspected, high prob. SOB with exertion FINDINGS: PULMONARY ARTERIES: Pulmonary arteries are adequately opacified for evaluation. No pulmonary embolism. Main pulmonary artery is normal in caliber. MEDIASTINUM: Mild cardiomegaly. Mild coronary atherosclerosis of the LAD. There is no acute abnormality of the thoracic aorta. LYMPH NODES: No mediastinal, hilar or axillary lymphadenopathy. LUNGS AND PLEURA: Mild paraseptal emphysematous changes. Small bilateral pleural effusions, right greater than left. No frank interstitial edema. No pneumothorax. UPPER ABDOMEN: Limited images of the  upper abdomen are unremarkable. SOFT TISSUES AND BONES: No acute bone or soft tissue abnormality. IMPRESSION: 1. No pulmonary embolism. 2. Small bilateral pleural effusions, right greater than left. 3. No frank interstitial edema. 4. Mild paraseptal emphysema; pulmonary emphysema is an independent risk factor for lung cancer and consideration is recommended for evaluation for a low-dose CT lung cancer screening program. 5. Mild cardiomegaly. Electronically signed by: Pinkie Pebbles MD MD 04/17/2024 09:12 PM EST RP Workstation: [id number]   DG Chest 2 View Result Date: 04/17/2024 CLINICAL DATA:  Dyspnea on exertion. EXAM: CHEST - 2 VIEW COMPARISON:  July 22, 2017 FINDINGS: The cardiac silhouette is mildly enlarged. There is mild prominence of the central pulmonary vasculature with mild bilateral suprahilar peribronchial cuffing. No focal consolidation, pleural effusion or pneumothorax is identified. The visualized skeletal structures are unremarkable. IMPRESSION: Mild cardiomegaly with mild central pulmonary vascular congestion. Electronically Signed   By: Suzen Dials M.D.   On: 04/17/2024 19:04    EKG: Independently reviewed.  Normal sinus rhythm.  Assessment/Plan Principal Problem:   Hypertensive urgency Active Problems:   Acute CHF (congestive heart failure) (HCC)    Acute CHF unknown EF new onset -     patient's symptoms are concerning for new onset CHF.  Lasix  has been ordered 40 mg IV every 12.  Patient also was started on nitroglycerin  infusion which we will try to wean off once oral antihypertensives started.  Check 2D echo.  Closely follow intake or metabolic panel daily weights.  Consult cardiology. Hypertensive urgency likely from being noncompliant with patient's medications.  Patient has been restarted on his losartan  and amlodipine .  Also on IV Lasix .  Try to wean off nitroglycerin  infusion once antihypertensives started orally.  Follow blood pressure trends. Elevated troponin  but has remained flat.  Will check 2D echo.  Aspirin . Cocaine abuse patient admits to have taken cocaine 2 weeks ago.  UDS is pending.  Advised about quitting. Emphysema was seen on the CAT scan.  Not wheezing actively. Chronic kidney disease stage III creatinine slightly elevated from baseline.  Follow metabolic panel closely.  Since patient has new onset acute CHF with hypertensive urgency will need further workup IV diuresis and more than 2 midnight stay.  DVT prophylaxis: Lovenox . Code Status: Full code. Family Communication: Discussed with patient. Disposition Plan: Stepdown. Consults called: Cardiology. Admission status: Inpatient.         [1]  Allergies Allergen Reactions   Fish Allergy Nausea And Vomiting and Swelling   Other Nausea And Vomiting and Swelling    All Seafood   Shellfish Allergy Nausea And Vomiting and Swelling   "

## 2024-04-18 ENCOUNTER — Encounter (HOSPITAL_COMMUNITY): Payer: Self-pay | Admitting: Internal Medicine

## 2024-04-18 ENCOUNTER — Inpatient Hospital Stay (HOSPITAL_COMMUNITY)

## 2024-04-18 DIAGNOSIS — R7989 Other specified abnormal findings of blood chemistry: Secondary | ICD-10-CM | POA: Diagnosis not present

## 2024-04-18 DIAGNOSIS — J439 Emphysema, unspecified: Secondary | ICD-10-CM | POA: Diagnosis not present

## 2024-04-18 DIAGNOSIS — I5043 Acute on chronic combined systolic (congestive) and diastolic (congestive) heart failure: Secondary | ICD-10-CM

## 2024-04-18 DIAGNOSIS — I5041 Acute combined systolic (congestive) and diastolic (congestive) heart failure: Secondary | ICD-10-CM | POA: Diagnosis not present

## 2024-04-18 DIAGNOSIS — I509 Heart failure, unspecified: Secondary | ICD-10-CM | POA: Diagnosis not present

## 2024-04-18 DIAGNOSIS — I161 Hypertensive emergency: Secondary | ICD-10-CM | POA: Diagnosis not present

## 2024-04-18 DIAGNOSIS — N183 Chronic kidney disease, stage 3 unspecified: Secondary | ICD-10-CM | POA: Insufficient documentation

## 2024-04-18 DIAGNOSIS — N1831 Chronic kidney disease, stage 3a: Secondary | ICD-10-CM | POA: Diagnosis not present

## 2024-04-18 DIAGNOSIS — F1910 Other psychoactive substance abuse, uncomplicated: Secondary | ICD-10-CM | POA: Diagnosis not present

## 2024-04-18 DIAGNOSIS — I160 Hypertensive urgency: Secondary | ICD-10-CM | POA: Diagnosis not present

## 2024-04-18 LAB — CBC
HCT: 38.7 % — ABNORMAL LOW (ref 39.0–52.0)
HCT: 38.1 % — ABNORMAL LOW (ref 39.0–52.0)
Hemoglobin: 12.6 g/dL — ABNORMAL LOW (ref 13.0–17.0)
Hemoglobin: 12.7 g/dL — ABNORMAL LOW (ref 13.0–17.0)
MCH: 29.2 pg (ref 26.0–34.0)
MCH: 29.9 pg (ref 26.0–34.0)
MCHC: 32.6 g/dL (ref 30.0–36.0)
MCHC: 33.3 g/dL (ref 30.0–36.0)
MCV: 89.8 fL (ref 80.0–100.0)
MCV: 89.6 fL (ref 80.0–100.0)
Platelets: 398 K/uL (ref 150–400)
Platelets: 385 K/uL (ref 150–400)
RBC: 4.31 MIL/uL (ref 4.22–5.81)
RBC: 4.25 MIL/uL (ref 4.22–5.81)
RDW: 14.6 % (ref 11.5–15.5)
RDW: 14.5 % (ref 11.5–15.5)
WBC: 6 K/uL (ref 4.0–10.5)
WBC: 5.9 K/uL (ref 4.0–10.5)
nRBC: 0 % (ref 0.0–0.2)
nRBC: 0 % (ref 0.0–0.2)

## 2024-04-18 LAB — COMPREHENSIVE METABOLIC PANEL WITH GFR
ALT: 109 U/L — ABNORMAL HIGH (ref 0–44)
AST: 51 U/L — ABNORMAL HIGH (ref 15–41)
Albumin: 4.1 g/dL (ref 3.5–5.0)
Alkaline Phosphatase: 97 U/L (ref 38–126)
Anion gap: 14 (ref 5–15)
BUN: 14 mg/dL (ref 6–20)
CO2: 27 mmol/L (ref 22–32)
Calcium: 9.1 mg/dL (ref 8.9–10.3)
Chloride: 99 mmol/L (ref 98–111)
Creatinine, Ser: 1.52 mg/dL — ABNORMAL HIGH (ref 0.61–1.24)
GFR, Estimated: 55 mL/min — ABNORMAL LOW
Glucose, Bld: 192 mg/dL — ABNORMAL HIGH (ref 70–99)
Potassium: 3.1 mmol/L — ABNORMAL LOW (ref 3.5–5.1)
Sodium: 141 mmol/L (ref 135–145)
Total Bilirubin: 0.5 mg/dL (ref 0.0–1.2)
Total Protein: 7.2 g/dL (ref 6.5–8.1)

## 2024-04-18 LAB — URINE DRUG SCREEN
Amphetamines: NEGATIVE
Barbiturates: NEGATIVE
Benzodiazepines: NEGATIVE
Cocaine: POSITIVE — AB
Fentanyl: NEGATIVE
Methadone Scn, Ur: NEGATIVE
Opiates: NEGATIVE
Tetrahydrocannabinol: NEGATIVE

## 2024-04-18 LAB — HIV ANTIBODY (ROUTINE TESTING W REFLEX): HIV Screen 4th Generation wRfx: NONREACTIVE

## 2024-04-18 LAB — ECHOCARDIOGRAM COMPLETE
Area-P 1/2: 4.89 cm2
Calc EF: 49.7 %
S' Lateral: 4.17 cm
Single Plane A2C EF: 50.4 %
Single Plane A4C EF: 48 %

## 2024-04-18 LAB — TSH: TSH: 0.606 u[IU]/mL (ref 0.350–4.500)

## 2024-04-18 LAB — CREATININE, SERUM
Creatinine, Ser: 1.53 mg/dL — ABNORMAL HIGH (ref 0.61–1.24)
GFR, Estimated: 55 mL/min — ABNORMAL LOW

## 2024-04-18 MED ORDER — THIAMINE HCL 100 MG/ML IJ SOLN
100.0000 mg | Freq: Every day | INTRAMUSCULAR | Status: DC
  Filled 2024-04-18: qty 2

## 2024-04-18 MED ORDER — MELATONIN 3 MG PO TABS: 3.0000 mg | ORAL_TABLET | Freq: Every evening | ORAL | Status: DC | PRN

## 2024-04-18 MED ORDER — ENOXAPARIN SODIUM 40 MG/0.4ML IJ SOSY
40.0000 mg | PREFILLED_SYRINGE | INTRAMUSCULAR | Status: DC
  Administered 2024-04-18 – 2024-04-19 (×2): 40 mg via SUBCUTANEOUS
  Filled 2024-04-18 (×2): qty 0.4

## 2024-04-18 MED ORDER — FOLIC ACID 1 MG PO TABS
1.0000 mg | ORAL_TABLET | Freq: Every day | ORAL | Status: DC
  Administered 2024-04-18 – 2024-04-19 (×2): 1 mg via ORAL
  Filled 2024-04-18 (×2): qty 1

## 2024-04-18 MED ORDER — PERFLUTREN LIPID MICROSPHERE
1.0000 mL | INTRAVENOUS | Status: AC | PRN
  Administered 2024-04-18: 3 mL via INTRAVENOUS

## 2024-04-18 MED ORDER — ACETAMINOPHEN 650 MG RE SUPP: 650.0000 mg | Freq: Four times a day (QID) | RECTAL | Status: DC | PRN

## 2024-04-18 MED ORDER — ADULT MULTIVITAMIN W/MINERALS CH
1.0000 | ORAL_TABLET | Freq: Every day | ORAL | Status: DC
  Administered 2024-04-18 – 2024-04-19 (×2): 1 via ORAL
  Filled 2024-04-18 (×2): qty 1

## 2024-04-18 MED ORDER — LORAZEPAM 1 MG PO TABS: 1.0000 mg | ORAL_TABLET | ORAL | Status: DC | PRN

## 2024-04-18 MED ORDER — ASPIRIN 81 MG PO TBEC
81.0000 mg | DELAYED_RELEASE_TABLET | Freq: Every day | ORAL | Status: DC
  Administered 2024-04-18 – 2024-04-19 (×2): 81 mg via ORAL
  Filled 2024-04-18 (×2): qty 1

## 2024-04-18 MED ORDER — FUROSEMIDE 10 MG/ML IJ SOLN
40.0000 mg | Freq: Two times a day (BID) | INTRAMUSCULAR | Status: AC
  Administered 2024-04-18 (×2): 40 mg via INTRAVENOUS
  Filled 2024-04-18 (×2): qty 4

## 2024-04-18 MED ORDER — ACETAMINOPHEN 325 MG PO TABS
650.0000 mg | ORAL_TABLET | Freq: Four times a day (QID) | ORAL | Status: DC | PRN
  Filled 2024-04-18: qty 2

## 2024-04-18 MED ORDER — ORAL CARE MOUTH RINSE: 15.0000 mL | OROMUCOSAL | Status: DC | PRN

## 2024-04-18 MED ORDER — POTASSIUM CHLORIDE 20 MEQ PO PACK
20.0000 meq | PACK | Freq: Once | ORAL | Status: AC
  Administered 2024-04-18: 20 meq via ORAL
  Filled 2024-04-18: qty 1

## 2024-04-18 MED ORDER — HYDRALAZINE HCL 25 MG PO TABS: 25.0000 mg | ORAL_TABLET | Freq: Four times a day (QID) | ORAL | Status: DC | PRN

## 2024-04-18 MED ORDER — LABETALOL HCL 200 MG PO TABS
200.0000 mg | ORAL_TABLET | Freq: Two times a day (BID) | ORAL | Status: DC
  Administered 2024-04-18 – 2024-04-19 (×3): 200 mg via ORAL
  Filled 2024-04-18: qty 1
  Filled 2024-04-18: qty 2
  Filled 2024-04-18: qty 1

## 2024-04-18 MED ORDER — LORAZEPAM 2 MG/ML IJ SOLN: 1.0000 mg | INTRAMUSCULAR | Status: DC | PRN

## 2024-04-18 MED ORDER — POTASSIUM CHLORIDE CRYS ER 20 MEQ PO TBCR
40.0000 meq | EXTENDED_RELEASE_TABLET | ORAL | Status: AC
  Administered 2024-04-18 (×2): 40 meq via ORAL
  Filled 2024-04-18 (×2): qty 2

## 2024-04-18 MED ORDER — IPRATROPIUM-ALBUTEROL 0.5-2.5 (3) MG/3ML IN SOLN: 3.0000 mL | RESPIRATORY_TRACT | Status: DC | PRN

## 2024-04-18 MED ORDER — IRBESARTAN 300 MG PO TABS
300.0000 mg | ORAL_TABLET | Freq: Every day | ORAL | Status: DC
  Administered 2024-04-18 – 2024-04-19 (×2): 300 mg via ORAL
  Filled 2024-04-18 (×2): qty 1

## 2024-04-18 MED ORDER — THIAMINE MONONITRATE 100 MG PO TABS
100.0000 mg | ORAL_TABLET | Freq: Every day | ORAL | Status: DC
  Administered 2024-04-18 – 2024-04-19 (×2): 100 mg via ORAL
  Filled 2024-04-18 (×2): qty 1

## 2024-04-18 NOTE — Consult Note (Signed)
 "  Cardiology Consultation   Patient ID: Darrell Wright MRN: [id number]; DOB: 28-Jul-1975  Admit date: 04/17/2024 Date of Consult: 04/18/2024  PCP:  Patient, No Pcp Per   [facility] Providers Cardiologist:  New to [facility] - Dr. Delford     Patient Profile: Darrell Wright is a 49 y.o. male with a hx of EtOH abuse, cocaine abuse, tobacco abuse, poorly controlled hypertension and asthma who is being seen 04/18/2024 for the evaluation of worsening dyspnea at the request of Dr. Kathrin.  History of Present Illness: [name] is a 49 year old male with past medical history of EtOH abuse, cocaine abuse, tobacco abuse, poorly controlled hypertension and asthma.  Father passed away from end-stage renal disease likely caused by uncontrolled blood pressure as well.  He drinks 12 pack of beer on the weekend.  He says he only smoked when he drink alcohol.  Last cocaine use was 5 days ago.  He was on warfarin briefly for VTE prophylaxis after his back surgery more than 8 years ago.  He was only on warfarin for a short period of time.  He has never developed a blood clot in the past.  To this day, warfarin is still listed on his home medication.  Patient was previously admitted at [facility] in October 2023 for chest pain.  Echocardiogram at that time showed EF 55 to 60%, no significant valvular issue.  Myoview obtained on 01/23/2022 was normal.  He has not been seen by cardiologist in the past.  He is on 5 different blood pressure medications including atenolol, HCTZ, losartan  and amlodipine .  He says he will occasionally miss his medication.  He has been having increasing shortness of breath and cough for the past week.  He describes the cough as a productive cough with thin phlegm.  He also noticed orthopnea when he tried to lay down at night but no paroxysmal nocturnal dyspnea.  He sleeps on his side.  He is running out of his medication and has not taken any of his blood pressure medication for 2 days prior  to arrival.   On arrival to [facility] on 04/17/2024, blood pressure was 213/149.  Respiratory rate was 22 breaths/min.  Chest x-ray showed mild cardiomegaly with mild central pulmonary vascular congestion.  EKG showed normal sinus rhythm with minimal T wave inversion in the inferior leads.  Urine drug test positive for cocaine.  Viral panel negative for COVID, RSV and flu. Creatinine was 1.6 on arrival, last creatinine was 1.0 about 6 years ago.  Potassium low at 3.1.  TSH normal.  CTA of the chest was negative for PE but showed right greater than left small bilateral pleural effusion, no significant interstitial edema, mild emphysema.  Patient was placed on IV nitroglycerin  for hypertensive emergency and pulmonary edema.  He was given 2 doses of 40 mg IV Lasix  last night and this morning.  At the time my interview, shortness of breath has completely resolved.  Blood pressure remained elevated in the 150s to 160s.  Past Medical History:  Diagnosis Date   Abrasions of multiple sites 11/03/2013   knees   Asthma    Laceration of forearm, left 11/03/2013   sutured   Metacarpal bone fracture 11/03/2013   left small    Past Surgical History:  Procedure Laterality Date   BLADDER REPAIR N/A 07/20/2017   Procedure: REPAIR OF BLADDER RUPTURE;  Surgeon: Watt Rush, MD;  Location: [facility] OR;  Service: Urology;  Laterality: N/A;  FACIAL LACERATION REPAIR N/A 07/19/2017   Procedure: FACIAL LACERATION REPAIR;  Surgeon: Mable Lenis, MD;  Location: [facility] OR;  Service: ENT;  Laterality: N/A;   INSERTION OF TRACTION PIN Left 07/19/2017   Procedure: INSERTION OF TRACTION PIN;  Surgeon: Cristy Bonner DASEN, MD;  Location: [facility] OR;  Service: Orthopedics;  Laterality: Left;   NO PAST SURGERIES     OPEN REDUCTION INTERNAL FIXATION (ORIF) METACARPAL Left 11/09/2013   Procedure: OPEN REDUCTION INTERNAL FIXATION (ORIF) OF LEFT SMALL FINGER METACARPAL FRACTURE    ;  Surgeon: Donnice Robinsons, MD;  Location: [facility];  Service: Orthopedics;  Laterality: Left;   ORIF ACETABULAR FRACTURE Left 07/20/2017   Procedure: OPEN REDUCTION INTERNAL FIXATION (ORIF) ACETABULAR FRACTURE;  Surgeon: Celena Sharper, MD;  Location: [facility] OR;  Service: Orthopedics;  Laterality: Left;   ORIF PELVIC FRACTURE Right 07/20/2017   Procedure: OPEN REDUCTION INTERNAL FIXATION (ORIF) PELVIC FRACTURE;  Surgeon: Celena Sharper, MD;  Location: [facility] OR;  Service: Orthopedics;  Laterality: Right;     Home Medications:  Prior to Admission medications  Medication Sig Start Date End Date Taking? Authorizing Provider  albuterol  (VENTOLIN  HFA) 108 (90 Base) MCG/ACT inhaler Inhale 2 puffs into the lungs every 6 (six) hours as needed for wheezing or shortness of breath. 02/08/24  Yes [provider]  amLODipine  (NORVASC ) 10 MG tablet Take 10 mg by mouth daily. 01/05/24  Yes [provider]  atenolol (TENORMIN) 50 MG tablet Take 50 mg by mouth daily. 01/05/24  Yes [provider]  hydrochlorothiazide (HYDRODIURIL) 25 MG tablet Take 25 mg by mouth daily. 01/05/24  Yes [provider]  losartan  (COZAAR ) 50 MG tablet Take 50 mg by mouth daily. 01/05/24  Yes [provider]  predniSONE (DELTASONE) 20 MG tablet Take two 20mg  tablets for three days, then switch to taking one 20mg  tablet for three days. 02/08/24  Yes [provider]  folic acid  (FOLVITE ) 1 MG tablet Take 1 tablet (1 mg total) by mouth daily. 08/04/17   Angiulli, Toribio PARAS, PA-C  gabapentin  (NEURONTIN ) 300 MG capsule Take 1 capsule (300 mg total) by mouth 2 (two) times daily. 11/16/17   Debby Fidela CROME, NP  lisinopril  (PRINIVIL ,ZESTRIL ) 10 MG tablet TAKE 1 TABLET BY MOUTH EVERY DAY 12/10/17   Debby Fidela CROME, NP  methocarbamol  (ROBAXIN ) 750 MG tablet Take 1 tablet (750 mg total) by mouth 4 (four) times daily. 09/15/17   Babs Arthea DASEN, MD  mirabegron  ER (MYRBETRIQ ) 25 MG TB24 tablet Take 1 tablet (25 mg total) by mouth daily. 08/04/17    Angiulli, Daniel J, PA-C  Multiple Vitamin (MULTIVITAMIN WITH MINERALS) TABS tablet Take 1 tablet by mouth daily. 08/04/17   Angiulli, Toribio PARAS, PA-C  oxybutynin  (DITROPAN ) 5 MG tablet Take 1 tablet (5 mg total) by mouth 3 (three) times daily. 08/04/17   Angiulli, Toribio PARAS, PA-C  Oxycodone  HCl 10 MG TABS Take 1 tablet (10 mg total) by mouth every 8 (eight) hours as needed. 12/15/17   Debby Fidela CROME, NP  polyethylene glycol (MIRALAX  / GLYCOLAX ) packet Take 17 g by mouth 2 (two) times daily. 08/04/17   Angiulli, Toribio PARAS, PA-C  traMADol  (ULTRAM ) 50 MG tablet Take 1 tablet (50 mg total) by mouth every 8 (eight) hours as needed. 12/15/17   Debby Fidela CROME, NP  warfarin (COUMADIN ) 5 MG tablet Take two Tablets in the evening. Use as directed according to results. 08/14/17   Debby Fidela CROME, NP    Scheduled Meds:  amLODipine   10 mg Oral Daily   aspirin  EC  81 mg Oral Daily   enoxaparin  (LOVENOX ) injection  40 mg Subcutaneous Q24H   furosemide   40 mg Intravenous Q12H   losartan   50 mg Oral Daily   potassium chloride   40 mEq Oral Q3H   Continuous Infusions:  nitroGLYCERIN  30 mcg/min (04/17/24 2330)   PRN Meds: acetaminophen  **OR** acetaminophen , melatonin  Allergies:   Allergies[1]  Social History:   Social History   Socioeconomic History   Marital status: Widowed    Spouse name: Not on file   Number of children: Not on file   Years of education: Not on file   Highest education level: Not on file  Occupational History   Not on file  Tobacco Use   Smoking status: Former    Types: Cigarettes   Smokeless tobacco: Never   Tobacco comments:    3 cig./day  Substance and Sexual Activity   Alcohol use: Yes   Drug use: Yes    Types: Cocaine   Sexual activity: Not on file  Other Topics Concern   Not on file  Social History Narrative   ** Merged History Encounter **       Social Drivers of Health   Tobacco Use: Medium Risk (04/18/2024)   Patient History    Smoking Tobacco Use:  Former    Smokeless Tobacco Use: Never    Passive Exposure: Not on file  Financial Resource Strain: Low Risk (04/15/2023)   Received from [facility]   Overall Financial Resource Strain (CARDIA)    Difficulty of Paying Living Expenses: Not very hard  Food Insecurity: No Food Insecurity (04/15/2023)   Received [facility]   Epic    Within the past 12 months, you worried that your food would run out before you got the money to buy more.: Never true    Within the past 12 months, the food you bought just didn't last and you didn't have money to get more.: Never true  Transportation Needs: No Transportation Needs (04/15/2023)   Received from [facility] - Transportation    Lack of Transportation (Medical): No    Lack of Transportation (Non-Medical): No  Physical Activity: Insufficiently Active (03/24/2023)   Received [facility]   Exercise Vital Sign    On average, how many days per week do you engage in moderate to strenuous exercise (like a brisk walk)?: 3 days    On average, how many minutes do you engage in exercise at this level?: 20 min  Stress: Stress Concern Present (03/24/2023)   Received from [facility] - Occupational Stress Questionnaire    Feeling of Stress : To some extent  Social Connections: Socially Integrated (03/24/2023)   Received from [facility]   Social Network    How would you rate your social network (family, work, friends)?: Good participation with social networks  Intimate Partner Violence: Not At Risk (03/24/2023)   Received from [facility]   HITS    Over the last 12 months how often did your partner physically hurt you?: Never    Over the last 12 months how often did your partner insult you or talk down to you?: Rarely    Over the last 12 months how often did your partner threaten you with physical harm?: Never    Over the last 12 months how often did your partner scream or curse at  you?: Sometimes  Depression (PHQ2-9): Not on file  Alcohol Screen: Not on file  Housing: Low Risk (04/15/2023)   Received from [facility]    In the last 12 months, was there a time when you were not able to pay the mortgage or rent on time?: No    In the past 12 months, how many times have you moved where you were living?: 1    At any time in the past 12 months, were you homeless or living in a shelter (including now)?: No  Utilities: Not At Risk (04/15/2023)   Received from [facility] Utilities    Threatened with loss of utilities: No  Health Literacy: Not on file    Family History:   Family History  Problem Relation Age of Onset   Hypertension Father    Kidney disease Father      ROS:  Please see the history of present illness.   All other ROS reviewed and negative.     Physical Exam/Data: Vitals:   04/18/24 0157 04/18/24 0245 04/18/24 0509 04/18/24 0610  BP: (!) 169/107 (!) 177/103  (!) 157/80  Pulse:  82  84  Resp:  14  16  Temp:   98.3 F (36.8 C)   TempSrc:   Oral   SpO2:  94%  93%    Intake/Output Summary (Last 24 hours) at 04/18/2024 0848 Last data filed at 04/18/2024 0700 Gross per 24 hour  Intake --  Output 3300 ml  Net -3300 ml      12/15/2017    9:51 AM 11/13/2017    8:28 AM 10/15/2017    8:17 AM  Last 3 Weights  Weight (lbs) 200 lb 12.8 oz 196 lb 192 lb  Weight (kg) 91.082 kg 88.905 kg 87.091 kg     There is no height or weight on file to calculate BMI.  General:  Well nourished, well developed, in no acute distress HEENT: normal Neck: no JVD Vascular: No carotid bruits; Distal pulses 2+ bilaterally Cardiac:  normal S1, S2; RRR; no murmur  Lungs:  clear to auscultation bilaterally, no wheezing, rhonchi or rales  Abd: soft, nontender, no hepatomegaly  Ext: trace left lower extremity edema Musculoskeletal:  No deformities, BUE and BLE strength normal and equal Skin: warm and dry  Neuro:  CNs 2-12 intact, no focal abnormalities  noted Psych:  Normal affect   EKG:  The EKG was personally reviewed and demonstrates: Normal sinus rhythm, with minimal T wave inversion in the inferior lead. Telemetry:  Telemetry was personally reviewed and demonstrates: Normal sinus rhythm, heart rate in the 60s to 70s, no significant ventricular ectopy.  Relevant CV Studies:  Echo 01/23/2022 Left Ventricle: Systolic function is normal. EF: 55-60%.    Left Ventricle: There is moderate hypertrophy.    Left Ventricle: Doppler parameters consistent with moderate diastolic  dysfunction and elevated LA pressure   Myoview 01/23/2022 NUCLEAR INTERPRETATION:  For the resting examination patient received total of 11.3 mCi radioactive technetium labeled sestamibi followed by SPECT imaging. For the stress examination patient underwent Lexiscan as per [facility] and received total of 35.4 mCi radioactive technetium labeled sestamibi followed by gated SPECT imaging.   There is no significant abnormality of the left ventricular wall motion or wall thickness noted with post-stress resting left ventricular ejection fraction of 57%  There are no myocardial perfusion defects seen during stress or resting images.   CONCLUSIONS:  This study is negative for myocardial ischemia with normal LVEF.  Laboratory Data: High Sensitivity Troponin:  No results for input(s): TROPONINIHS in the last 720 hours.   Chemistry Recent Labs  Lab 04/17/24 1953 04/18/24 0214  NA 141 141  K 3.1* 3.1*  CL 104 99  CO2 27 27  GLUCOSE 97 192*  BUN 19 14  CREATININE 1.63* 1.53*  1.52*  CALCIUM 8.8* 9.1  GFRNONAA 51* 55*  55*  ANIONGAP 10 14    Recent Labs  Lab 04/18/24 0214  PROT 7.2  ALBUMIN  4.1  AST 51*  ALT 109*  ALKPHOS 97  BILITOT 0.5   Lipids No results for input(s): CHOL, TRIG, HDL, LABVLDL, LDLCALC, CHOLHDL in the last 168 hours.  Hematology Recent Labs  Lab 04/17/24 2218 04/18/24 0214  WBC 8.3 5.9  6.0  RBC 4.42 4.25  4.31   HGB 13.3 12.7*  12.6*  HCT 39.7 38.1*  38.7*  MCV 89.8 89.6  89.8  MCH 30.1 29.9  29.2  MCHC 33.5 33.3  32.6  RDW 14.6 14.5  14.6  PLT 382 385  398   Thyroid  Recent Labs  Lab 04/18/24 0214  TSH 0.606    BNP Recent Labs  Lab 04/17/24 1953  PROBNP 6,006.0*    DDimer No results for input(s): DDIMER in the last 168 hours.  Radiology/Studies:  CT Angio Chest PE W and/or Wo Contrast Result Date: 04/17/2024 EXAM: CTA of the Chest with contrast for PE 04/17/2024 09:04:16 PM TECHNIQUE: CTA of the chest was performed after the administration of 75 mL of iohexol  (OMNIPAQUE ) 350 MG/ML injection. Multiplanar reformatted images are provided for review. MIP images are provided for review. Automated exposure control, iterative reconstruction, and/or weight based adjustment of the mA/kV was utilized to reduce the radiation dose to as low as reasonably achievable. COMPARISON: CT chest 07/19/2017 CLINICAL HISTORY: Pulmonary embolism (PE) suspected, high prob. SOB with exertion FINDINGS: PULMONARY ARTERIES: Pulmonary arteries are adequately opacified for evaluation. No pulmonary embolism. Main pulmonary artery is normal in caliber. MEDIASTINUM: Mild cardiomegaly. Mild coronary atherosclerosis of the LAD. There is no acute abnormality of the thoracic aorta. LYMPH NODES: No mediastinal, hilar or axillary lymphadenopathy. LUNGS AND PLEURA: Mild paraseptal emphysematous changes. Small bilateral pleural effusions, right greater than left. No frank interstitial edema. No pneumothorax. UPPER ABDOMEN: Limited images of the upper abdomen are unremarkable. SOFT TISSUES AND BONES: No acute bone or soft tissue abnormality. IMPRESSION: 1. No pulmonary embolism. 2. Small bilateral pleural effusions, right greater than left. 3. No frank interstitial edema. 4. Mild paraseptal emphysema; pulmonary emphysema is an independent risk factor for lung cancer and consideration is recommended for evaluation for a low-dose CT  lung cancer screening program. 5. Mild cardiomegaly. Electronically signed by: Pinkie Pebbles MD MD 04/17/2024 09:12 PM EST RP Workstation: [id number]   DG Chest 2 View Result Date: 04/17/2024 CLINICAL DATA:  Dyspnea on exertion. EXAM: CHEST - 2 VIEW COMPARISON:  July 22, 2017 FINDINGS: The cardiac silhouette is mildly enlarged. There is mild prominence of the central pulmonary vasculature with mild bilateral suprahilar peribronchial cuffing. No focal consolidation, pleural effusion or pneumothorax is identified. The visualized skeletal structures are unremarkable. IMPRESSION: Mild cardiomegaly with mild central pulmonary vascular congestion. Electronically Signed   By: Suzen Dials M.D.   On: 04/17/2024 19:04     Assessment and Plan: Acute diastolic heart failure: In the setting of hypertensive emergency and cocaine use  - Received 2 doses of 40 mg IV Lasix .  Appears to be euvolemic on exam, trace left lower extremity edema, no  right lower extremity edema at this point.  - Pending echocardiogram.  Will hold off on additional diuretic.  - If echocardiogram is normal, do not anticipate any further cardiac workup.  Hypertensive emergency  - Likely in the setting of 2 days of missed blood pressure medications and also cocaine use.  Discussed with the patient that mixing cocaine and atenolol can potentially lead to severely uncontrolled blood pressure.  - Recommend switch losartan  to stronger ARB such as irbesartan  or olmesartan.  - Switch home atenolol to a nonselective beta-blocker such as carvedilol tomorrow morning (to avoid significant drop in BP within 24 hours hypertensive emergency).   - resume hydrochlorothiazide tomorrow morning  Cocaine abuse: Strongly advised to stop cocaine use given poorly controlled baseline blood pressure  EtOH abuse: Patient drinks a 12 pack per weekend.  Advised to cut back on alcohol usage.  Tobacco use: tobacco cessation advised  Muscle cramps: Newly  developed in the ER.  Likely related to low potassium and diuretic use.  Continue with potassium supplement, additional KCl ordered for today.  Acute on chronic renal insufficiency: Baseline creatinine around 1.2-1.4, arrived with creatinine 1.6.  Renal function stable after IV Lasix .   Risk Assessment/Risk Scores:       New [place]  Heart Association (NYHA) Functional Class NYHA Class III       For questions or updates, please contact [facility] Please consult [email or web address] for contact info under    Signed, Scot Ford, [place]  04/18/2024 8:48 AM     [1]  Allergies Allergen Reactions   Fish Allergy Nausea And Vomiting and Swelling   Other Nausea And Vomiting and Swelling    All Seafood   Shellfish Allergy Nausea And Vomiting and Swelling   "

## 2024-04-18 NOTE — Progress Notes (Signed)
 Echocardiogram 2D Echocardiogram has been performed.  Darrell Wright 04/18/2024, 2:45 PM

## 2024-04-18 NOTE — ED Notes (Signed)
 Patient sleeping, urinal emptied and food tray removed from room.  Belongings are bagged up in room.

## 2024-04-18 NOTE — Progress Notes (Signed)
 " PROGRESS NOTE  Darrell Wright FMW:[id number] DOB: Jan 21, 1976   PCP: Patient, No Pcp Per  Patient is from: Home.  Independently ambulates at baseline.  DOA: 04/17/2024 LOS: 1  Chief complaints Chief Complaint  Patient presents with   Cough   Shortness of Breath     Brief Narrative / Interim history: 49 year old M with PMH of HTN, asthma, CKD-3A, polysubstance use including alcohol, cocaine and tobacco presenting with shortness of breath, DOE, edema, orthopnea and PND for about a week in the setting of not taking his antihypertensive meds, and admitted with hypertensive urgency and acute CHF.  In ED, SBP as high as 200s.  DBP as high as 130s.  K3.1.  proBNP elevated to 6000.  Troponin 71 and 63.  EKG without acute ischemic finding.  COVID-19, influenza and RSV PCR nonreactive.  CXR with mild cardiomegaly and mild central pulmonary vascular congestion.  CT angio chest negative for PE but revealed 2 small bilateral pleural effusions.  Patient was started on nitro drip, Lasix  and antihypertensive meds.  Echocardiogram ordered.  Cardiology consulted.   Subjective: Seen and examined earlier this morning.  No major events overnight or this morning.  Reports improvement in his breathing.  He denies orthopnea or PND.  Denies chest pain.  Reports intermittent cough but denies other URI symptoms.  Eager to go home.   Assessment and plan: Acute CHF: Likely diastolic.  In the setting of noncompliance with antihypertensive meds, cocaine and alcohol use.  Presented with cardiac symptoms as above.  Very hypertensive.  proBNP elevated to 6000.  CXR with cardiomegaly, vascular congestion.  CT angio chest with small bilateral pleural effusion. -Appreciate help by cardiology-defer diuretics and antihypertensive meds to cardiology. -Follow echocardiogram - Strict intake and output, daily weight, renal functions and electrolytes - Encourage cocaine, tobacco and alcohol cessation.  Hypertensive emergency: BP  213/149 on arrival.  Due to noncompliance and polysubstance use.  Improved. -On labetalol , Avapro  and amlodipine  per cardiology. -Wean off nitro drip. -Add p.o. hydralazine  as needed.  Elevated troponin: Mild.  No significant delta.  EKG without acute ischemic finding.  No report of chest pain.  Likely demand ischemia - Follow echocardiogram - Manage CHF and hypertension as above  Polysubstance use-reports drinking about 12 cans of beers when he wipes with bowel.  Admits to cocaine use.  Reports smoking few cigarettes.  Last use about 4 days ago.  No withdrawal symptoms. - Start CIWA with as needed Ativan  - Multivitamin, thiamine  and folic acid  - Encourage cessation. - TOC consult  CKD-3A: Creatinine improved. Recent Labs    04/17/24 1953 04/18/24 0214  BUN 19 14  CREATININE 1.63* 1.53*  1.52*  - Continue monitoring  History of asthma?  Frequent cough but denies shortness of breath.  No wheezing on exam.  RVP negative -Nebs as needed.  Hypokalemia -Monitor replenish K and Mg as appropriate  There is no height or weight on file to calculate BMI.           DVT prophylaxis:  enoxaparin  (LOVENOX ) injection 40 mg Start: 04/18/24 1000  Code Status: Full code Family Communication: None at bedside Level of care: Telemetry Status is: Inpatient Remains inpatient appropriate because: Hypertensive emergency and acute CHF   Final disposition: Home   55 minutes with more than 50% spent in reviewing records, counseling patient/family and coordinating care.  Consultants:  Cardiology  Procedures: None  Microbiology summarized: COVID-19, influenza and RSV PCR nonreactive  Objective: Vitals:   04/18/24 0509 04/18/24 9389  04/18/24 1016 04/18/24 1019  BP:  (!) 157/80 (!) 162/115   Pulse:  84 84   Resp:  16    Temp: 98.3 F (36.8 C)   98.3 F (36.8 C)  TempSrc: Oral     SpO2:  93%      Examination:  GENERAL: No apparent distress.  Nontoxic. HEENT: MMM.   Vision and hearing grossly intact.  NECK: Supple.  No apparent JVD.  RESP:  No IWOB.  Lahaie aeration bilaterally.  Frequent cough during exam. CVS:  RRR. Heart sounds normal.  ABD/GI/GU: BS+. Abd soft, NTND.  MSK/EXT:  Moves extremities. No apparent deformity. No edema.  SKIN: no apparent skin lesion or wound NEURO: AA.  Oriented appropriately.  No apparent focal neuro deficit. PSYCH: Calm. Normal affect.   Sch Meds:  Scheduled Meds:  amLODipine   10 mg Oral Daily   aspirin  EC  81 mg Oral Daily   enoxaparin  (LOVENOX ) injection  40 mg Subcutaneous Q24H   folic acid   1 mg Oral Daily   furosemide   40 mg Intravenous Q12H   irbesartan   300 mg Oral Daily   labetalol   200 mg Oral BID   multivitamin with minerals  1 tablet Oral Daily   potassium chloride   40 mEq Oral Q3H   thiamine   100 mg Oral Daily   Or   thiamine   100 mg Intravenous Daily   Continuous Infusions:  nitroGLYCERIN  30 mcg/min (04/18/24 0856)   PRN Meds:.acetaminophen  **OR** acetaminophen , LORazepam  **OR** LORazepam , melatonin  Antimicrobials: Anti-infectives (From admission, onward)    None        I have personally reviewed the following labs and images: CBC: Recent Labs  Lab 04/17/24 2218 04/18/24 0214  WBC 8.3 5.9  6.0  NEUTROABS 7.6  --   HGB 13.3 12.7*  12.6*  HCT 39.7 38.1*  38.7*  MCV 89.8 89.6  89.8  PLT 382 385  398   BMP &GFR Recent Labs  Lab 04/17/24 1953 04/18/24 0214  NA 141 141  K 3.1* 3.1*  CL 104 99  CO2 27 27  GLUCOSE 97 192*  BUN 19 14  CREATININE 1.63* 1.53*  1.52*  CALCIUM 8.8* 9.1   CrCl cannot be calculated (Unknown ideal weight.). Liver & Pancreas: Recent Labs  Lab 04/18/24 0214  AST 51*  ALT 109*  ALKPHOS 97  BILITOT 0.5  PROT 7.2  ALBUMIN  4.1   No results for input(s): LIPASE, AMYLASE in the last 168 hours. No results for input(s): AMMONIA in the last 168 hours. Diabetic: No results for input(s): HGBA1C in the last 72 hours. No results for  input(s): GLUCAP in the last 168 hours. Cardiac Enzymes: No results for input(s): CKTOTAL, CKMB, CKMBINDEX, TROPONINI in the last 168 hours. Recent Labs    04/17/24 1953  PROBNP 6,006.0*   Coagulation Profile: Recent Labs  Lab 04/17/24 2218  INR 1.0   Thyroid Function Tests: Recent Labs    04/18/24 0214  TSH 0.606   Lipid Profile: No results for input(s): CHOL, HDL, LDLCALC, TRIG, CHOLHDL, LDLDIRECT in the last 72 hours. Anemia Panel: No results for input(s): VITAMINB12, FOLATE, FERRITIN, TIBC, IRON, RETICCTPCT in the last 72 hours. Urine analysis:    Component Value Date/Time   COLORURINE YELLOW 07/22/2017 2000   APPEARANCEUR HAZY (A) 07/22/2017 2000   LABSPEC 1.013 07/22/2017 2000   PHURINE 9.0 (H) 07/22/2017 2000   GLUCOSEU NEGATIVE 07/22/2017 2000   HGBUR LARGE (A) 07/22/2017 2000   BILIRUBINUR NEGATIVE 07/22/2017 2000  KETONESUR NEGATIVE 07/22/2017 2000   PROTEINUR 30 (A) 07/22/2017 2000   NITRITE NEGATIVE 07/22/2017 2000   LEUKOCYTESUR SMALL (A) 07/22/2017 2000   Sepsis Labs: Invalid input(s): PROCALCITONIN, LACTICIDVEN  Microbiology: Recent Results (from the past 240 hours)  Resp panel by RT-PCR (RSV, Flu A&B, Covid) Anterior Nasal Swab     Status: None   Collection Time: 04/17/24  7:52 PM   Specimen: Anterior Nasal Swab  Result Value Ref Range Status   SARS Coronavirus 2 by RT PCR NEGATIVE NEGATIVE Final    Comment: (NOTE) SARS-CoV-2 target nucleic acids are NOT DETECTED.  The SARS-CoV-2 RNA is generally detectable in upper respiratory specimens during the acute phase of infection. The lowest concentration of SARS-CoV-2 viral copies this assay can detect is 138 copies/mL. A negative result does not preclude SARS-Cov-2 infection and should not be used as the sole basis for treatment or other patient management decisions. A negative result may occur with  improper specimen collection/handling, submission of  specimen other than nasopharyngeal swab, presence of viral mutation(s) within the areas targeted by this assay, and inadequate number of viral copies(<138 copies/mL). A negative result must be combined with clinical observations, patient history, and epidemiological information. The expected result is Negative.  Fact Sheet for Patients:  bloggercourse.com  Fact Sheet for Healthcare Providers:  seriousbroker.it  This test is no t yet approved or cleared by the [place]  FDA and  has been authorized for detection and/or diagnosis of SARS-CoV-2 by FDA under an Emergency Use Authorization (EUA). This EUA will remain  in effect (meaning this test can be used) for the duration of the COVID-19 declaration under Section 564(b)(1) of the Act, 21 U.S.C.section 360bbb-3(b)(1), unless the authorization is terminated  or revoked sooner.       Influenza A by PCR NEGATIVE NEGATIVE Final   Influenza B by PCR NEGATIVE NEGATIVE Final    Comment: (NOTE) The Xpert Xpress SARS-CoV-2/FLU/RSV plus assay is intended as an aid in the diagnosis of influenza from Nasopharyngeal swab specimens and should not be used as a sole basis for treatment. Nasal washings and aspirates are unacceptable for Xpert Xpress SARS-CoV-2/FLU/RSV testing.  Fact Sheet for Patients: bloggercourse.com  Fact Sheet for Healthcare Providers: seriousbroker.it  This test is not yet approved or cleared by the [place]  FDA and has been authorized for detection and/or diagnosis of SARS-CoV-2 by FDA under an Emergency Use Authorization (EUA). This EUA will remain in effect (meaning this test can be used) for the duration of the COVID-19 declaration under Section 564(b)(1) of the Act, 21 U.S.C. section 360bbb-3(b)(1), unless the authorization is terminated or revoked.     Resp Syncytial Virus by PCR NEGATIVE NEGATIVE Final     Comment: (NOTE) Fact Sheet for Patients: bloggercourse.com  Fact Sheet for Healthcare Providers: seriousbroker.it  This test is not yet approved or cleared by the [place]  FDA and has been authorized for detection and/or diagnosis of SARS-CoV-2 by FDA under an Emergency Use Authorization (EUA). This EUA will remain in effect (meaning this test can be used) for the duration of the COVID-19 declaration under Section 564(b)(1) of the Act, 21 U.S.C. section 360bbb-3(b)(1), unless the authorization is terminated or revoked.  Performed at [facility], [address], [place], [place] [address]     Radiology Studies: CT Angio Chest PE W and/or Wo Contrast Result Date: 04/17/2024 EXAM: CTA of the Chest with contrast for PE 04/17/2024 09:04:16 PM TECHNIQUE: CTA of the chest was performed after the administration  of 75 mL of iohexol  (OMNIPAQUE ) 350 MG/ML injection. Multiplanar reformatted images are provided for review. MIP images are provided for review. Automated exposure control, iterative reconstruction, and/or weight based adjustment of the mA/kV was utilized to reduce the radiation dose to as low as reasonably achievable. COMPARISON: CT chest 07/19/2017 CLINICAL HISTORY: Pulmonary embolism (PE) suspected, high prob. SOB with exertion FINDINGS: PULMONARY ARTERIES: Pulmonary arteries are adequately opacified for evaluation. No pulmonary embolism. Main pulmonary artery is normal in caliber. MEDIASTINUM: Mild cardiomegaly. Mild coronary atherosclerosis of the LAD. There is no acute abnormality of the thoracic aorta. LYMPH NODES: No mediastinal, hilar or axillary lymphadenopathy. LUNGS AND PLEURA: Mild paraseptal emphysematous changes. Small bilateral pleural effusions, right greater than left. No frank interstitial edema. No pneumothorax. UPPER ABDOMEN: Limited images of the upper abdomen are unremarkable. SOFT TISSUES AND  BONES: No acute bone or soft tissue abnormality. IMPRESSION: 1. No pulmonary embolism. 2. Small bilateral pleural effusions, right greater than left. 3. No frank interstitial edema. 4. Mild paraseptal emphysema; pulmonary emphysema is an independent risk factor for lung cancer and consideration is recommended for evaluation for a low-dose CT lung cancer screening program. 5. Mild cardiomegaly. Electronically signed by: Pinkie Pebbles MD MD 04/17/2024 09:12 PM EST RP Workstation: [id number]   DG Chest 2 View Result Date: 04/17/2024 CLINICAL DATA:  Dyspnea on exertion. EXAM: CHEST - 2 VIEW COMPARISON:  July 22, 2017 FINDINGS: The cardiac silhouette is mildly enlarged. There is mild prominence of the central pulmonary vasculature with mild bilateral suprahilar peribronchial cuffing. No focal consolidation, pleural effusion or pneumothorax is identified. The visualized skeletal structures are unremarkable. IMPRESSION: Mild cardiomegaly with mild central pulmonary vascular congestion. Electronically Signed   By: Suzen Dials M.D.   On: 04/17/2024 19:04      Mahmud Keithly T. Lamiya Naas [facility]  If 7PM-7AM, please contact night-coverage [email or web address] 04/18/2024, 11:11 AM   "

## 2024-04-18 NOTE — ED Notes (Signed)
"  Blood sent to lab  "

## 2024-04-19 ENCOUNTER — Other Ambulatory Visit (HOSPITAL_COMMUNITY): Payer: Self-pay

## 2024-04-19 DIAGNOSIS — R7989 Other specified abnormal findings of blood chemistry: Secondary | ICD-10-CM | POA: Diagnosis not present

## 2024-04-19 DIAGNOSIS — J439 Emphysema, unspecified: Secondary | ICD-10-CM | POA: Diagnosis not present

## 2024-04-19 DIAGNOSIS — I160 Hypertensive urgency: Secondary | ICD-10-CM | POA: Diagnosis not present

## 2024-04-19 DIAGNOSIS — I5041 Acute combined systolic (congestive) and diastolic (congestive) heart failure: Secondary | ICD-10-CM

## 2024-04-19 LAB — CBC
HCT: 40.1 % (ref 39.0–52.0)
Hemoglobin: 13 g/dL (ref 13.0–17.0)
MCH: 29.5 pg (ref 26.0–34.0)
MCHC: 32.4 g/dL (ref 30.0–36.0)
MCV: 91.1 fL (ref 80.0–100.0)
Platelets: 418 K/uL — ABNORMAL HIGH (ref 150–400)
RBC: 4.4 MIL/uL (ref 4.22–5.81)
RDW: 14.9 % (ref 11.5–15.5)
WBC: 6.7 K/uL (ref 4.0–10.5)
nRBC: 0 % (ref 0.0–0.2)

## 2024-04-19 LAB — COMPREHENSIVE METABOLIC PANEL WITH GFR
ALT: 73 U/L — ABNORMAL HIGH (ref 0–44)
AST: 43 U/L — ABNORMAL HIGH (ref 15–41)
Albumin: 3.5 g/dL (ref 3.5–5.0)
Alkaline Phosphatase: 83 U/L (ref 38–126)
Anion gap: 9 (ref 5–15)
BUN: 18 mg/dL (ref 6–20)
CO2: 29 mmol/L (ref 22–32)
Calcium: 9.2 mg/dL (ref 8.9–10.3)
Chloride: 101 mmol/L (ref 98–111)
Creatinine, Ser: 1.48 mg/dL — ABNORMAL HIGH (ref 0.61–1.24)
GFR, Estimated: 57 mL/min — ABNORMAL LOW
Glucose, Bld: 87 mg/dL (ref 70–99)
Potassium: 3.9 mmol/L (ref 3.5–5.1)
Sodium: 139 mmol/L (ref 135–145)
Total Bilirubin: 0.4 mg/dL (ref 0.0–1.2)
Total Protein: 6.3 g/dL — ABNORMAL LOW (ref 6.5–8.1)

## 2024-04-19 LAB — MAGNESIUM: Magnesium: 2.3 mg/dL (ref 1.7–2.4)

## 2024-04-19 MED ORDER — ASPIRIN 81 MG PO TBEC: 81.0000 mg | DELAYED_RELEASE_TABLET | Freq: Every day | ORAL | Status: AC

## 2024-04-19 MED ORDER — IRBESARTAN 300 MG PO TABS
300.0000 mg | ORAL_TABLET | Freq: Every day | ORAL | 0 refills | Status: AC
  Filled 2024-04-19: qty 90, 90d supply, fill #0

## 2024-04-19 MED ORDER — LABETALOL HCL 200 MG PO TABS
200.0000 mg | ORAL_TABLET | Freq: Two times a day (BID) | ORAL | 0 refills | Status: AC
  Filled 2024-04-19: qty 180, 90d supply, fill #0

## 2024-04-19 MED ORDER — FOLIC ACID 1 MG PO TABS
1.0000 mg | ORAL_TABLET | Freq: Every day | ORAL | 0 refills | Status: AC
  Filled 2024-04-19: qty 30, 30d supply, fill #0

## 2024-04-19 MED ORDER — ADULT MULTIVITAMIN W/MINERALS CH: 1.0000 | ORAL_TABLET | Freq: Every day | ORAL | Status: AC

## 2024-04-19 MED ORDER — AMLODIPINE BESYLATE 10 MG PO TABS
10.0000 mg | ORAL_TABLET | Freq: Every day | ORAL | 0 refills | Status: AC
  Filled 2024-04-19: qty 90, 90d supply, fill #0

## 2024-04-19 MED ORDER — POTASSIUM CHLORIDE CRYS ER 20 MEQ PO TBCR: 40.0000 meq | EXTENDED_RELEASE_TABLET | ORAL | Status: DC

## 2024-04-19 MED ORDER — VITAMIN B-1 100 MG PO TABS
100.0000 mg | ORAL_TABLET | Freq: Every day | ORAL | 0 refills | Status: AC
  Filled 2024-04-19: qty 90, 90d supply, fill #0

## 2024-04-19 NOTE — Progress Notes (Signed)
 Patient to be discharged to home today. All discharge instructions including all discharge Medications and schedules for these Medications reviewed with the Patient. Patient verbalized understanding of all discharge instructions and teaching. Discharge RN brought Medications from Outpatient Pharmacy to the room. Discharge AVS with the Patient at time of discharge

## 2024-04-19 NOTE — TOC Transition Note (Signed)
 Transition of Care [facility]) - Discharge Note   Patient Details  Name: Darrell Wright MRN: [id number] Date of Birth: 12-Mar-1976  [facility] CM/SW Contact:  Bascom Service, RN Phone Number: 04/19/2024, 1:31 PM   Clinical Narrative:  SA Resources added to AVS, Has PCP d/t insurance,& case worker. Has own transport home. No further CM needs.     Final next level of care: Home/Self Care Barriers to Discharge: No Barriers Identified   Patient Goals and CMS Choice Patient states their goals for this hospitalization and ongoing recovery are:: Home CMS Medicare.gov Compare Post Acute Care list provided to:: Patient Choice offered to / list presented to : Patient      Discharge Placement                       Discharge Plan and Services Additional resources added to the After Visit Summary for     Discharge Planning Services: CM Consult Post Acute Care Choice: Resumption of Svcs/PTA Provider                               Social Drivers of Health (SDOH) Interventions SDOH Screenings   Food Insecurity: No Food Insecurity (04/18/2024)  Housing: High Risk (04/18/2024)  Transportation Needs: No Transportation Needs (04/18/2024)  Utilities: Not At Risk (04/18/2024)  Financial Resource Strain: Low Risk (04/15/2023)   Received from [facility]  Physical Activity: Insufficiently Active (03/24/2023)   Received from [facility]  Social Connections: Socially Integrated (03/24/2023)   Received from [facility]  Stress: Stress Concern Present (03/24/2023)   Received from [facility]  Tobacco Use: Medium Risk (04/18/2024)     Readmission Risk Interventions     No data to display

## 2024-04-19 NOTE — Progress Notes (Signed)
 Discharge meds in a secure bag delivered to patient by this RN

## 2024-04-19 NOTE — Progress Notes (Signed)
 "   Subjective:  Denies SSCP, palpitations or Dyspnea Missed his aunts funeral   Objective:  Vitals:   04/18/24 1929 04/18/24 2248 04/19/24 0307 04/19/24 0430  BP: (!) 166/98 102/73 (!) 130/95   Pulse: 75 68 68   Resp: 17 17 16    Temp: 98.2 F (36.8 C) 98.4 F (36.9 C) 98.4 F (36.9 C)   TempSrc:      SpO2: 97% 97% 100%   Weight:    85.5 kg  Height:        Intake/Output from previous day:  Intake/Output Summary (Last 24 hours) at 04/19/2024 0908 Last data filed at 04/19/2024 0820 Gross per 24 hour  Intake 245.66 ml  Output --  Net 245.66 ml    Physical Exam: Affect appropriate Healthy:  appears stated age HEENT: normal Neck supple with no adenopathy JVP normal no bruits no thyromegaly Lungs clear with no wheezing and good diaphragmatic motion Heart:  S1/S2 no murmur, no rub, gallop or click PMI normal Abdomen: benighn, BS positve, no tenderness, no AAA no bruit.  No HSM or HJR Distal pulses intact with no bruits No edema Neuro non-focal Skin warm and dry No muscular weakness   Lab Results: Basic Metabolic Panel: Recent Labs    04/17/24 1953 04/18/24 0214  NA 141 141  K 3.1* 3.1*  CL 104 99  CO2 27 27  GLUCOSE 97 192*  BUN 19 14  CREATININE 1.63* 1.53*  1.52*  CALCIUM 8.8* 9.1   Liver Function Tests: Recent Labs    04/18/24 0214  AST 51*  ALT 109*  ALKPHOS 97  BILITOT 0.5  PROT 7.2  ALBUMIN  4.1   No results for input(s): LIPASE, AMYLASE in the last 72 hours. CBC: Recent Labs    04/17/24 2218 04/18/24 0214  WBC 8.3 5.9  6.0  NEUTROABS 7.6  --   HGB 13.3 12.7*  12.6*  HCT 39.7 38.1*  38.7*  MCV 89.8 89.6  89.8  PLT 382 385  398    Thyroid Function Tests: Recent Labs    04/18/24 0214  TSH 0.606   Imaging: ECHOCARDIOGRAM COMPLETE Result Date: 04/18/2024    ECHOCARDIOGRAM REPORT   Patient Name:   Darrell Wright Date of Exam: 04/18/2024 Medical Rec #:  [id number]    Height:       65.0 in Accession #:    [id number]    Weight:       200.8 lb Date of Birth:  [birth year]     BSA:          1.981 m Patient Age:    49 years     BP:           157/80 mmHg Patient Gender: M            HR:           74 bpm. Exam Location:  Inpatient Procedure: 2D Echo, Cardiac Doppler, Color Doppler and Intracardiac            Opacification Agent (Both Spectral and Color Flow Doppler were            utilized during procedure). Indications:    CHF 150.9  History:        Patient has prior history of Echocardiogram examinations, most                 recent 01/23/2022. CHF, CKD; Risk Factors:Former Smoker. [facility]  echo 01/23/22: EF 55-60%, moderate LVH, diastolic dysfunction.  Sonographer:    Merlynn Argyle Referring Phys: [phone] REDIA LOISE CLEAVER  Sonographer Comments: Image acquisition challenging due to respiratory motion. IMPRESSIONS  1. Left ventricular ejection fraction, by estimation, is 45 to 50%. The left ventricle has mildly decreased function. The left ventricle demonstrates global hypokinesis. There is moderate concentric left ventricular hypertrophy. Left ventricular diastolic parameters are consistent with Grade II diastolic dysfunction (pseudonormalization).  2. Right ventricular systolic function is normal. The right ventricular size is mildly enlarged. There is normal pulmonary artery systolic pressure.  3. Left atrial size was moderately dilated.  4. Right atrial size was moderately dilated.  5. The mitral valve is normal in structure. Trivial mitral valve regurgitation. No evidence of mitral stenosis.  6. The aortic valve is tricuspid. There is mild calcification of the aortic valve. Aortic valve regurgitation is not visualized. No aortic stenosis is present.  7. The inferior vena cava is normal in size with greater than 50% respiratory variability, suggesting right atrial pressure of 3 mmHg. Comparison(s): Prior images unable to be directly viewed, comparison made by report only. Changes from prior study are noted.  Conclusion(s)/Recommendation(s): No left ventricular mural or apical thrombus/thrombi. Mildly reduced LVEF with moderate LVH. FINDINGS  Left Ventricle: Left ventricular ejection fraction, by estimation, is 45 to 50%. The left ventricle has mildly decreased function. The left ventricle demonstrates global hypokinesis. Definity  contrast agent was given IV to delineate the left ventricular  endocardial borders. The left ventricular internal cavity size was normal in size. There is moderate concentric left ventricular hypertrophy. Left ventricular diastolic parameters are consistent with Grade II diastolic dysfunction (pseudonormalization). Right Ventricle: The right ventricular size is mildly enlarged. Right vetricular wall thickness was not well visualized. Right ventricular systolic function is normal. There is normal pulmonary artery systolic pressure. The tricuspid regurgitant velocity  is 2.74 m/s, and with an assumed right atrial pressure of 3 mmHg, the estimated right ventricular systolic pressure is 33.0 mmHg. Left Atrium: Prominent coumadin  ridge. Left atrial size was moderately dilated. Right Atrium: Right atrial size was moderately dilated. Pericardium: Trivial pericardial effusion is present. Mitral Valve: The mitral valve is normal in structure. Trivial mitral valve regurgitation. No evidence of mitral valve stenosis. Tricuspid Valve: The tricuspid valve is normal in structure. Tricuspid valve regurgitation is trivial. No evidence of tricuspid stenosis. Aortic Valve: The aortic valve is tricuspid. There is mild calcification of the aortic valve. Aortic valve regurgitation is not visualized. No aortic stenosis is present. Pulmonic Valve: The pulmonic valve was not well visualized. Pulmonic valve regurgitation is trivial. No evidence of pulmonic stenosis. Aorta: The aortic root, ascending aorta, aortic arch and descending aorta are all structurally normal, with no evidence of dilitation or obstruction.  Venous: The inferior vena cava is normal in size with greater than 50% respiratory variability, suggesting right atrial pressure of 3 mmHg. IAS/Shunts: The atrial septum is grossly normal.  LEFT VENTRICLE PLAX 2D LVIDd:         5.50 cm      Diastology LVIDs:         4.17 cm      LV e' medial:    5.55 cm/s LV PW:         1.50 cm      LV E/e' medial:  18.6 LV IVS:        1.50 cm      LV e' lateral:   7.72 cm/s LVOT diam:     2.20  cm      LV E/e' lateral: 13.3 LV SV:         72 LV SV Index:   36 LVOT Area:     3.80 cm LV IVRT:       119 msec  LV Volumes (MOD) LV vol d, MOD A2C: 167.0 ml LV vol d, MOD A4C: 158.0 ml LV vol s, MOD A2C: 82.9 ml LV vol s, MOD A4C: 82.1 ml LV SV MOD A2C:     84.1 ml LV SV MOD A4C:     158.0 ml LV SV MOD BP:      81.6 ml RIGHT VENTRICLE             IVC RV Basal diam:  4.30 cm     IVC diam: 1.80 cm RV Mid diam:    3.10 cm RV S prime:     12.40 cm/s TAPSE (M-mode): 2.4 cm LEFT ATRIUM             Index        RIGHT ATRIUM           Index LA diam:        5.30 cm 2.67 cm/m   RA Area:     21.40 cm LA Vol (A2C):   80.6 ml 40.68 ml/m  RA Volume:   69.60 ml  35.13 ml/m LA Vol (A4C):   48.2 ml 24.33 ml/m LA Biplane Vol: 64.4 ml 32.50 ml/m  AORTIC VALVE             PULMONIC VALVE LVOT Vmax:   110.00 cm/s PR End Diast Vel: 5.57 msec LVOT Vmean:  79.000 cm/s LVOT VTI:    0.190 m  AORTA Ao Root diam: 3.30 cm Ao Asc diam:  3.40 cm MITRAL VALVE                TRICUSPID VALVE MV Area (PHT): 4.89 cm     TR Peak grad:   30.0 mmHg MV Decel Time: 155 msec     TR Vmax:        274.00 cm/s MV E velocity: 103.00 cm/s MV A velocity: 52.40 cm/s   SHUNTS MV E/A ratio:  1.97         Systemic VTI:  0.19 m                             Systemic Diam: 2.20 cm Shelda Bruckner MD Electronically signed by Shelda Bruckner MD Signature Date/Time: 04/18/2024/7:02:26 PM    Final    CT Angio Chest PE W and/or Wo Contrast Result Date: 04/17/2024 EXAM: CTA of the Chest with contrast for PE 04/17/2024 09:04:16 PM  TECHNIQUE: CTA of the chest was performed after the administration of 75 mL of iohexol  (OMNIPAQUE ) 350 MG/ML injection. Multiplanar reformatted images are provided for review. MIP images are provided for review. Automated exposure control, iterative reconstruction, and/or weight based adjustment of the mA/kV was utilized to reduce the radiation dose to as low as reasonably achievable. COMPARISON: CT chest 07/19/2017 CLINICAL HISTORY: Pulmonary embolism (PE) suspected, high prob. SOB with exertion FINDINGS: PULMONARY ARTERIES: Pulmonary arteries are adequately opacified for evaluation. No pulmonary embolism. Main pulmonary artery is normal in caliber. MEDIASTINUM: Mild cardiomegaly. Mild coronary atherosclerosis of the LAD. There is no acute abnormality of the thoracic aorta. LYMPH NODES: No mediastinal, hilar or axillary lymphadenopathy. LUNGS AND PLEURA: Mild paraseptal emphysematous changes. Small bilateral pleural effusions, right greater than left.  No frank interstitial edema. No pneumothorax. UPPER ABDOMEN: Limited images of the upper abdomen are unremarkable. SOFT TISSUES AND BONES: No acute bone or soft tissue abnormality. IMPRESSION: 1. No pulmonary embolism. 2. Small bilateral pleural effusions, right greater than left. 3. No frank interstitial edema. 4. Mild paraseptal emphysema; pulmonary emphysema is an independent risk factor for lung cancer and consideration is recommended for evaluation for a low-dose CT lung cancer screening program. 5. Mild cardiomegaly. Electronically signed by: Pinkie Pebbles MD MD 04/17/2024 09:12 PM EST RP Workstation: [id number]   DG Chest 2 View Result Date: 04/17/2024 CLINICAL DATA:  Dyspnea on exertion. EXAM: CHEST - 2 VIEW COMPARISON:  July 22, 2017 FINDINGS: The cardiac silhouette is mildly enlarged. There is mild prominence of the central pulmonary vasculature with mild bilateral suprahilar peribronchial cuffing. No focal consolidation, pleural effusion or  pneumothorax is identified. The visualized skeletal structures are unremarkable. IMPRESSION: Mild cardiomegaly with mild central pulmonary vascular congestion. Electronically Signed   By: Suzen Dials M.D.   On: 04/17/2024 19:04    Cardiac Studies:  ECG: SR LAE nonspecific ST changes    Telemetry:  NSR  Echo: EF 45-50%   Medications:    amLODipine   10 mg Oral Daily   aspirin  EC  81 mg Oral Daily   enoxaparin  (LOVENOX ) injection  40 mg Subcutaneous Q24H   folic acid   1 mg Oral Daily   irbesartan   300 mg Oral Daily   labetalol   200 mg Oral BID   multivitamin with minerals  1 tablet Oral Daily   thiamine   100 mg Oral Daily   Or   thiamine   100 mg Intravenous Daily      Assessment/Plan:   HTN Urgency:  improved taking meds and as cocaine washes out. 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  Ok to d/c home will arrange outpatient f/u although I doubt he will comply. 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

## 2024-04-19 NOTE — Discharge Summary (Signed)
 "  Physician Discharge Summary  Darrell Wright FMW:[id number] DOB: [birth year] DOA: 04/17/2024  PCP: Patient, No Pcp Per  Admit date: 04/17/2024 Discharge date: 04/19/2024  Admitted From: Home Disposition: Home Recommendations for Outpatient Follow-up:  Encouraged to establish care with PCP as soon as possible Cardiology to arrange outpatient follow-up Check blood pressure, CMP and CBC at follow-up Please follow up on the following pending results: None  Home Health: No need identified Equipment/Devices: No need identified  Discharge Condition: Stable CODE STATUS: Full code    Hospital course 49 year old M with PMH of HTN, asthma, CKD-3A, polysubstance use including alcohol, cocaine and tobacco presenting with shortness of breath, DOE, edema, orthopnea and PND for about a week in the setting of not taking his antihypertensive meds, and admitted with hypertensive urgency and acute CHF.   In ED, SBP as high as 200s.  DBP as high as 130s.  K3.1.  proBNP elevated to 6000.  Troponin 71 and 63.  EKG without acute ischemic finding.  COVID-19, influenza and RSV PCR nonreactive.  CXR with mild cardiomegaly and mild central pulmonary vascular congestion.  CT angio chest negative for PE but revealed 2 small bilateral pleural effusions.  Patient was started on nitro drip, Lasix  and antihypertensive meds.  Echocardiogram ordered.  Cardiology consulted.  The next day, blood pressure improved.  Patient felt well.  Evaluated by cardiology who adjusted his antihypertensive meds.  Echocardiogram with LVEF of 45 to 50%, G2 DD and GH.   On the day of discharge, patient symptoms resolved.  Blood pressure improved.  Cleared for discharge by cardiology on new antihypertensive regimens.  Cardiology to arrange outpatient follow-up.  Extensively counseled to refrain from using cocaine, drinking alcohol and smoking cigarettes.  Also encouraged to establish care with PCP as soon as possible.  See individual problem  list below for more.   Problems addressed during this hospitalization Acute combined CHF: TTE as above.  In the setting of noncompliance with antihypertensive meds, cocaine and alcohol use.  Presented with cardiac symptoms as above.  Very hypertensive on admission.  proBNP elevated to 6000.  CXR with cardiomegaly, vascular congestion.  CT angio chest with small bilateral pleural effusion.  Diuresed with IV Lasix .  Respiratory symptoms resolved.  Blood pressure improved as well. -Cleared for discharge by cardiology on new antihypertensive regimen as below. -Encourage cocaine, tobacco and alcohol cessation. -Counseled on the importance of compliance with his medications. -Cardiology to arrange outpatient follow-up.   Hypertensive emergency: BP 213/149 on arrival.  Due to noncompliance and polysubstance use.  Improved. - Discharged on labetalol , Avapro  and amlodipine  per cardiology.   Elevated troponin: Mild.  No significant delta.  EKG without acute ischemic finding.  No report of chest pain.  Likely demand ischemia.  Echocardiogram without RWMA. -Started on low-dose aspirin  per cardiology.   Polysubstance use-reports drinking about 12 cans of beers when he wipes with bowel.  Admits to cocaine use.  Reports smoking few cigarettes.  Last use about 4 days ago.  No withdrawal symptoms. - Multivitamin, thiamine  and folic acid  - Encourage cessation.   CKD-3A: Creatinine improved.   History of asthma?  Frequent cough but denies shortness of breath.  No wheezing on exam.  RVP negative -Nebs as needed.   Hypokalemia: Resolved.   Class I obesity Body mass index is 31.37 kg/m.           Consultations: Cardiology  Time spent 35  minutes  Vital signs Vitals:   04/19/24 0307 04/19/24 0430 04/19/24  9078 04/19/24 0928  BP: (!) 130/95  (!) 138/99 (!) 144/109  Pulse: 68   75  Temp: 98.4 F (36.9 C)     Resp: 16     Height:      Weight:  85.5 kg    SpO2: 100%     TempSrc:      BMI  (Calculated):  31.37       Discharge exam  GENERAL: No apparent distress.  Nontoxic. HEENT: MMM.  Vision and hearing grossly intact.  NECK: Supple.  No apparent JVD.  RESP:  No IWOB.  Locascio aeration bilaterally. CVS:  RRR. Heart sounds normal.  ABD/GI/GU: BS+. Abd soft, NTND.  MSK/EXT:  Moves extremities. No apparent deformity. No edema.  SKIN: no apparent skin lesion or wound NEURO: Awake and alert. Oriented appropriately.  No apparent focal neuro deficit. PSYCH: Calm. Normal affect.   Discharge Instructions Discharge Instructions     Discharge instructions   Complete by: As directed    It has been a pleasure taking care of you!  You were hospitalized due to heart failure and markedly elevated blood pressure.  We have started you on new blood pressure medications.  It is very important that you take your medications as prescribed.  There is also very important that you refrain from using cocaine, drinking alcohol and smoking cigarettes.  Follow-up with your primary care doctor in 1 to 2 weeks or sooner if needed.  Follow-up with cardiology per their recommendation.   Take care,   Increase activity slowly   Complete by: As directed       Allergies as of 04/19/2024       Reactions   Fish Allergy Nausea And Vomiting, Swelling   Other Nausea And Vomiting, Swelling   All Seafood   Shellfish Allergy Nausea And Vomiting, Swelling        Medication List     STOP taking these medications    atenolol 50 MG tablet Commonly known as: TENORMIN   gabapentin  300 MG capsule Commonly known as: NEURONTIN    hydrochlorothiazide 25 MG tablet Commonly known as: HYDRODIURIL   lisinopril  10 MG tablet Commonly known as: ZESTRIL    losartan  50 MG tablet Commonly known as: COZAAR    methocarbamol  750 MG tablet Commonly known as: ROBAXIN    mirabegron  ER 25 MG Tb24 tablet Commonly known as: MYRBETRIQ    oxybutynin  5 MG tablet Commonly known as: DITROPAN    Oxycodone  HCl 10 MG  Tabs   predniSONE 20 MG tablet Commonly known as: DELTASONE   traMADol  50 MG tablet Commonly known as: ULTRAM    warfarin 5 MG tablet Commonly known as: Coumadin        TAKE these medications    albuterol  108 (90 Base) MCG/ACT inhaler Commonly known as: VENTOLIN  HFA Inhale 2 puffs into the lungs every 6 (six) hours as needed for wheezing or shortness of breath.   amLODipine  10 MG tablet Commonly known as: NORVASC  Take 1 tablet (10 mg total) by mouth daily.   aspirin  EC 81 MG tablet Take 1 tablet (81 mg total) by mouth daily. Swallow whole. Start taking on: April 20, 2024   folic acid  1 MG tablet Commonly known as: FOLVITE  Take 1 tablet (1 mg total) by mouth daily.   irbesartan  300 MG tablet Commonly known as: AVAPRO  Take 1 tablet (300 mg total) by mouth daily. Start taking on: April 20, 2024   labetalol  200 MG tablet Commonly known as: NORMODYNE  Take 1 tablet (200 mg total) by mouth 2 (two)  times daily.   multivitamin with minerals Tabs tablet Take 1 tablet by mouth daily.   polyethylene glycol 17 g packet Commonly known as: MIRALAX  / GLYCOLAX  Take 17 g by mouth 2 (two) times daily.   thiamine  100 MG tablet Commonly known as: VITAMIN B1 Take 1 tablet (100 mg total) by mouth daily. Start taking on: April 20, 2024         Procedures/Studies:   ECHOCARDIOGRAM COMPLETE Result Date: 04/18/2024    ECHOCARDIOGRAM REPORT   Patient Name:   JAWUAN ROBB Date of Exam: 04/18/2024 Medical Rec #:  [id number]    Height:       65.0 in Accession #:    [id number]   Weight:       200.8 lb Date of Birth:  April 22, 1975     BSA:          1.981 m Patient Age:    49 years     BP:           157/80 mmHg Patient Gender: M            HR:           74 bpm. Exam Location:  Inpatient Procedure: 2D Echo, Cardiac Doppler, Color Doppler and Intracardiac            Opacification Agent (Both Spectral and Color Flow Doppler were            utilized during procedure). Indications:    CHF 150.9   History:        Patient has prior history of Echocardiogram examinations, most                 recent 01/23/2022. CHF, CKD; Risk Factors:Former Smoker. [facility]                 echo 01/23/22: EF 55-60%, moderate LVH, diastolic dysfunction.  Sonographer:    Merlynn Argyle Referring Phys: [phone] REDIA LOISE CLEAVER  Sonographer Comments: Image acquisition challenging due to respiratory motion. IMPRESSIONS  1. Left ventricular ejection fraction, by estimation, is 45 to 50%. The left ventricle has mildly decreased function. The left ventricle demonstrates global hypokinesis. There is moderate concentric left ventricular hypertrophy. Left ventricular diastolic parameters are consistent with Grade II diastolic dysfunction (pseudonormalization).  2. Right ventricular systolic function is normal. The right ventricular size is mildly enlarged. There is normal pulmonary artery systolic pressure.  3. Left atrial size was moderately dilated.  4. Right atrial size was moderately dilated.  5. The mitral valve is normal in structure. Trivial mitral valve regurgitation. No evidence of mitral stenosis.  6. The aortic valve is tricuspid. There is mild calcification of the aortic valve. Aortic valve regurgitation is not visualized. No aortic stenosis is present.  7. The inferior vena cava is normal in size with greater than 50% respiratory variability, suggesting right atrial pressure of 3 mmHg. Comparison(s): Prior images unable to be directly viewed, comparison made by report only. Changes from prior study are noted. Conclusion(s)/Recommendation(s): No left ventricular mural or apical thrombus/thrombi. Mildly reduced LVEF with moderate LVH. FINDINGS  Left Ventricle: Left ventricular ejection fraction, by estimation, is 45 to 50%. The left ventricle has mildly decreased function. The left ventricle demonstrates global hypokinesis. Definity  contrast agent was given IV to delineate the left ventricular  endocardial borders. The left  ventricular internal cavity size was normal in size. There is moderate concentric left ventricular hypertrophy. Left ventricular diastolic parameters are consistent with Grade II diastolic dysfunction (pseudonormalization). Right  Ventricle: The right ventricular size is mildly enlarged. Right vetricular wall thickness was not well visualized. Right ventricular systolic function is normal. There is normal pulmonary artery systolic pressure. The tricuspid regurgitant velocity  is 2.74 m/s, and with an assumed right atrial pressure of 3 mmHg, the estimated right ventricular systolic pressure is 33.0 mmHg. Left Atrium: Prominent coumadin  ridge. Left atrial size was moderately dilated. Right Atrium: Right atrial size was moderately dilated. Pericardium: Trivial pericardial effusion is present. Mitral Valve: The mitral valve is normal in structure. Trivial mitral valve regurgitation. No evidence of mitral valve stenosis. Tricuspid Valve: The tricuspid valve is normal in structure. Tricuspid valve regurgitation is trivial. No evidence of tricuspid stenosis. Aortic Valve: The aortic valve is tricuspid. There is mild calcification of the aortic valve. Aortic valve regurgitation is not visualized. No aortic stenosis is present. Pulmonic Valve: The pulmonic valve was not well visualized. Pulmonic valve regurgitation is trivial. No evidence of pulmonic stenosis. Aorta: The aortic root, ascending aorta, aortic arch and descending aorta are all structurally normal, with no evidence of dilitation or obstruction. Venous: The inferior vena cava is normal in size with greater than 50% respiratory variability, suggesting right atrial pressure of 3 mmHg. IAS/Shunts: The atrial septum is grossly normal.  LEFT VENTRICLE PLAX 2D LVIDd:         5.50 cm      Diastology LVIDs:         4.17 cm      LV e' medial:    5.55 cm/s LV PW:         1.50 cm      LV E/e' medial:  18.6 LV IVS:        1.50 cm      LV e' lateral:   7.72 cm/s LVOT diam:      2.20 cm      LV E/e' lateral: 13.3 LV SV:         72 LV SV Index:   36 LVOT Area:     3.80 cm LV IVRT:       119 msec  LV Volumes (MOD) LV vol d, MOD A2C: 167.0 ml LV vol d, MOD A4C: 158.0 ml LV vol s, MOD A2C: 82.9 ml LV vol s, MOD A4C: 82.1 ml LV SV MOD A2C:     84.1 ml LV SV MOD A4C:     158.0 ml LV SV MOD BP:      81.6 ml RIGHT VENTRICLE             IVC RV Basal diam:  4.30 cm     IVC diam: 1.80 cm RV Mid diam:    3.10 cm RV S prime:     12.40 cm/s TAPSE (M-mode): 2.4 cm LEFT ATRIUM             Index        RIGHT ATRIUM           Index LA diam:        5.30 cm 2.67 cm/m   RA Area:     21.40 cm LA Vol (A2C):   80.6 ml 40.68 ml/m  RA Volume:   69.60 ml  35.13 ml/m LA Vol (A4C):   48.2 ml 24.33 ml/m LA Biplane Vol: 64.4 ml 32.50 ml/m  AORTIC VALVE             PULMONIC VALVE LVOT Vmax:   110.00 cm/s PR End Diast Vel: 5.57 msec LVOT Vmean:  79.000 cm/s LVOT  VTI:    0.190 m  AORTA Ao Root diam: 3.30 cm Ao Asc diam:  3.40 cm MITRAL VALVE                TRICUSPID VALVE MV Area (PHT): 4.89 cm     TR Peak grad:   30.0 mmHg MV Decel Time: 155 msec     TR Vmax:        274.00 cm/s MV E velocity: 103.00 cm/s MV A velocity: 52.40 cm/s   SHUNTS MV E/A ratio:  1.97         Systemic VTI:  0.19 m                             Systemic Diam: 2.20 cm Shelda Bruckner MD Electronically signed by Shelda Bruckner MD Signature Date/Time: 04/18/2024/7:02:26 PM    Final    CT Angio Chest PE W and/or Wo Contrast Result Date: 04/17/2024 EXAM: CTA of the Chest with contrast for PE 04/17/2024 09:04:16 PM TECHNIQUE: CTA of the chest was performed after the administration of 75 mL of iohexol  (OMNIPAQUE ) 350 MG/ML injection. Multiplanar reformatted images are provided for review. MIP images are provided for review. Automated exposure control, iterative reconstruction, and/or weight based adjustment of the mA/kV was utilized to reduce the radiation dose to as low as reasonably achievable. COMPARISON: CT chest 07/19/2017 CLINICAL  HISTORY: Pulmonary embolism (PE) suspected, high prob. SOB with exertion FINDINGS: PULMONARY ARTERIES: Pulmonary arteries are adequately opacified for evaluation. No pulmonary embolism. Main pulmonary artery is normal in caliber. MEDIASTINUM: Mild cardiomegaly. Mild coronary atherosclerosis of the LAD. There is no acute abnormality of the thoracic aorta. LYMPH NODES: No mediastinal, hilar or axillary lymphadenopathy. LUNGS AND PLEURA: Mild paraseptal emphysematous changes. Small bilateral pleural effusions, right greater than left. No frank interstitial edema. No pneumothorax. UPPER ABDOMEN: Limited images of the upper abdomen are unremarkable. SOFT TISSUES AND BONES: No acute bone or soft tissue abnormality. IMPRESSION: 1. No pulmonary embolism. 2. Small bilateral pleural effusions, right greater than left. 3. No frank interstitial edema. 4. Mild paraseptal emphysema; pulmonary emphysema is an independent risk factor for lung cancer and consideration is recommended for evaluation for a low-dose CT lung cancer screening program. 5. Mild cardiomegaly. Electronically signed by: Pinkie Pebbles MD MD 04/17/2024 09:12 PM EST RP Workstation: [id number]   DG Chest 2 View Result Date: 04/17/2024 CLINICAL DATA:  Dyspnea on exertion. EXAM: CHEST - 2 VIEW COMPARISON:  July 22, 2017 FINDINGS: The cardiac silhouette is mildly enlarged. There is mild prominence of the central pulmonary vasculature with mild bilateral suprahilar peribronchial cuffing. No focal consolidation, pleural effusion or pneumothorax is identified. The visualized skeletal structures are unremarkable. IMPRESSION: Mild cardiomegaly with mild central pulmonary vascular congestion. Electronically Signed   By: Suzen Dials M.D.   On: 04/17/2024 19:04       The results of significant diagnostics from this hospitalization (including imaging, microbiology, ancillary and laboratory) are listed below for reference.     Microbiology: Recent  Results (from the past 240 hours)  Resp panel by RT-PCR (RSV, Flu A&B, Covid) Anterior Nasal Swab     Status: None   Collection Time: 04/17/24  7:52 PM   Specimen: Anterior Nasal Swab  Result Value Ref Range Status   SARS Coronavirus 2 by RT PCR NEGATIVE NEGATIVE Final    Comment: (NOTE) SARS-CoV-2 target nucleic acids are NOT DETECTED.  The SARS-CoV-2 RNA is generally detectable in upper respiratory specimens  during the acute phase of infection. The lowest concentration of SARS-CoV-2 viral copies this assay can detect is 138 copies/mL. A negative result does not preclude SARS-Cov-2 infection and should not be used as the sole basis for treatment or other patient management decisions. A negative result may occur with  improper specimen collection/handling, submission of specimen other than nasopharyngeal swab, presence of viral mutation(s) within the areas targeted by this assay, and inadequate number of viral copies(<138 copies/mL). A negative result must be combined with clinical observations, patient history, and epidemiological information. The expected result is Negative.  Fact Sheet for Patients:  bloggercourse.com  Fact Sheet for Healthcare Providers:  seriousbroker.it  This test is no t yet approved or cleared by the [place]  FDA and  has been authorized for detection and/or diagnosis of SARS-CoV-2 by FDA under an Emergency Use Authorization (EUA). This EUA will remain  in effect (meaning this test can be used) for the duration of the COVID-19 declaration under Section 564(b)(1) of the Act, 21 U.S.C.section 360bbb-3(b)(1), unless the authorization is terminated  or revoked sooner.       Influenza A by PCR NEGATIVE NEGATIVE Final   Influenza B by PCR NEGATIVE NEGATIVE Final    Comment: (NOTE) The Xpert Xpress SARS-CoV-2/FLU/RSV plus assay is intended as an aid in the diagnosis of influenza from Nasopharyngeal swab  specimens and should not be used as a sole basis for treatment. Nasal washings and aspirates are unacceptable for Xpert Xpress SARS-CoV-2/FLU/RSV testing.  Fact Sheet for Patients: bloggercourse.com  Fact Sheet for Healthcare Providers: seriousbroker.it  This test is not yet approved or cleared by the [place]  FDA and has been authorized for detection and/or diagnosis of SARS-CoV-2 by FDA under an Emergency Use Authorization (EUA). This EUA will remain in effect (meaning this test can be used) for the duration of the COVID-19 declaration under Section 564(b)(1) of the Act, 21 U.S.C. section 360bbb-3(b)(1), unless the authorization is terminated or revoked.     Resp Syncytial Virus by PCR NEGATIVE NEGATIVE Final    Comment: (NOTE) Fact Sheet for Patients: bloggercourse.com  Fact Sheet for Healthcare Providers: seriousbroker.it  This test is not yet approved or cleared by the [place]  FDA and has been authorized for detection and/or diagnosis of SARS-CoV-2 by FDA under an Emergency Use Authorization (EUA). This EUA will remain in effect (meaning this test can be used) for the duration of the COVID-19 declaration under Section 564(b)(1) of the Act, 21 U.S.C. section 360bbb-3(b)(1), unless the authorization is terminated or revoked.  Performed at [facility], [address] [place], [place] [address]      Labs:  CBC: Recent Labs  Lab 04/17/24 2218 04/18/24 0214 04/19/24 1042  WBC 8.3 5.9  6.0 6.7  NEUTROABS 7.6  --   --   HGB 13.3 12.7*  12.6* 13.0  HCT 39.7 38.1*  38.7* 40.1  MCV 89.8 89.6  89.8 91.1  PLT 382 385  398 418*   BMP &GFR Recent Labs  Lab 04/17/24 1953 04/18/24 0214 04/19/24 1042  NA 141 141 139  K 3.1* 3.1* 3.9  CL 104 99 101  CO2 27 27 29   GLUCOSE 97 192* 87  BUN 19 14 18   CREATININE 1.63* 1.53*  1.52*  1.48*  CALCIUM 8.8* 9.1 9.2  MG  --   --  2.3   Estimated Creatinine Clearance: 60.1 mL/min (A) (by C-G formula based on SCr of 1.48 mg/dL (H)). Liver & Pancreas: Recent Labs  Lab  04/18/24 0214 04/19/24 1042  AST 51* 43*  ALT 109* 73*  ALKPHOS 97 83  BILITOT 0.5 0.4  PROT 7.2 6.3*  ALBUMIN  4.1 3.5   No results for input(s): LIPASE, AMYLASE in the last 168 hours. No results for input(s): AMMONIA in the last 168 hours. Diabetic: No results for input(s): HGBA1C in the last 72 hours. No results for input(s): GLUCAP in the last 168 hours. Cardiac Enzymes: No results for input(s): CKTOTAL, CKMB, CKMBINDEX, TROPONINI in the last 168 hours. Recent Labs    04/17/24 1953  PROBNP 6,006.0*   Coagulation Profile: Recent Labs  Lab 04/17/24 2218  INR 1.0   Thyroid Function Tests: Recent Labs    04/18/24 0214  TSH 0.606   Lipid Profile: No results for input(s): CHOL, HDL, LDLCALC, TRIG, CHOLHDL, LDLDIRECT in the last 72 hours. Anemia Panel: No results for input(s): VITAMINB12, FOLATE, FERRITIN, TIBC, IRON, RETICCTPCT in the last 72 hours. Urine analysis:    Component Value Date/Time   COLORURINE YELLOW 07/22/2017 2000   APPEARANCEUR HAZY (A) 07/22/2017 2000   LABSPEC 1.013 07/22/2017 2000   PHURINE 9.0 (H) 07/22/2017 2000   GLUCOSEU NEGATIVE 07/22/2017 2000   HGBUR LARGE (A) 07/22/2017 2000   BILIRUBINUR NEGATIVE 07/22/2017 2000   KETONESUR NEGATIVE 07/22/2017 2000   PROTEINUR 30 (A) 07/22/2017 2000   NITRITE NEGATIVE 07/22/2017 2000   LEUKOCYTESUR SMALL (A) 07/22/2017 2000   Sepsis Labs: Invalid input(s): PROCALCITONIN, LACTICIDVEN   SIGNED:  Kalai Baca T Labrina Lines, MD  [facility] 04/19/2024, 5:03 PM   "

## 2024-04-19 NOTE — Progress Notes (Signed)
 Heart Failure Navigator Progress Note  Assessed for Heart & Vascular TOC clinic readiness.  Patient has a follow up with [facility] on 2/2. No HF TOC at this time.  Navigator available for reassessment of patient.   Duwaine Plant, PharmD, BCPS Heart Failure Stewardship Pharmacist Phone [phone]

## 2024-04-19 NOTE — Plan of Care (Signed)
 Bp in safe range No overnight events   Problem: Health Behavior/Discharge Planning: Goal: Ability to manage health-related needs will improve Outcome: Progressing   Problem: Clinical Measurements: Goal: Ability to maintain clinical measurements within normal limits will improve Outcome: Progressing   Problem: Clinical Measurements: Goal: Respiratory complications will improve Outcome: Progressing

## 2024-05-09 ENCOUNTER — Ambulatory Visit: Admitting: Physician Assistant
